# Patient Record
Sex: Male | Born: 1940 | Race: White | Hispanic: No | Marital: Married | Smoking: Former smoker
Health system: Southern US, Community
[De-identification: ages and names within clinical notes are randomized; demographics above are authoritative.]

## PROBLEM LIST (undated history)

## (undated) DIAGNOSIS — E1129 Type 2 diabetes mellitus with other diabetic kidney complication: Secondary | ICD-10-CM

## (undated) DIAGNOSIS — E785 Hyperlipidemia, unspecified: Secondary | ICD-10-CM

## (undated) DIAGNOSIS — R319 Hematuria, unspecified: Secondary | ICD-10-CM

## (undated) DIAGNOSIS — I639 Cerebral infarction, unspecified: Secondary | ICD-10-CM

## (undated) DIAGNOSIS — E538 Deficiency of other specified B group vitamins: Secondary | ICD-10-CM

## (undated) DIAGNOSIS — E039 Hypothyroidism, unspecified: Secondary | ICD-10-CM

## (undated) DIAGNOSIS — M1990 Unspecified osteoarthritis, unspecified site: Secondary | ICD-10-CM

## (undated) DIAGNOSIS — L80 Vitiligo: Secondary | ICD-10-CM

## (undated) DIAGNOSIS — D509 Iron deficiency anemia, unspecified: Principal | ICD-10-CM

## (undated) HISTORY — PX: CHOLECYSTECTOMY: SHX55

## (undated) HISTORY — PX: CERVICAL SPINE SURGERY: SHX589

## (undated) HISTORY — PX: CAROTID ENDARTERECTOMY: SUR193

## (undated) HISTORY — DX: Iron deficiency anemia, unspecified: D50.9

## (undated) HISTORY — PX: LAMINOTOMY / EXCISION DISK POSTERIOR CERVICAL SPINE: SUR749

## (undated) HISTORY — PX: NOSE SURGERY: SHX723

---

## 1999-01-05 ENCOUNTER — Encounter: Payer: Self-pay | Admitting: Neurosurgery

## 1999-01-08 ENCOUNTER — Encounter: Payer: Self-pay | Admitting: Neurosurgery

## 1999-01-08 ENCOUNTER — Observation Stay (HOSPITAL_COMMUNITY): Admission: RE | Admit: 1999-01-08 | Discharge: 1999-01-09 | Payer: Self-pay | Admitting: Neurosurgery

## 1999-02-20 ENCOUNTER — Encounter: Payer: Self-pay | Admitting: Neurosurgery

## 1999-02-20 ENCOUNTER — Ambulatory Visit (HOSPITAL_COMMUNITY): Admission: RE | Admit: 1999-02-20 | Discharge: 1999-02-20 | Payer: Self-pay | Admitting: Neurosurgery

## 1999-10-30 ENCOUNTER — Inpatient Hospital Stay (HOSPITAL_COMMUNITY)
Admission: RE | Admit: 1999-10-30 | Discharge: 1999-11-06 | Payer: Self-pay | Admitting: Physical Medicine & Rehabilitation

## 1999-11-03 ENCOUNTER — Encounter: Payer: Self-pay | Admitting: Physical Medicine & Rehabilitation

## 1999-11-06 ENCOUNTER — Inpatient Hospital Stay (HOSPITAL_COMMUNITY): Admission: AD | Admit: 1999-11-06 | Discharge: 1999-11-07 | Payer: Self-pay | Admitting: Vascular Surgery

## 2000-02-01 ENCOUNTER — Encounter
Admission: RE | Admit: 2000-02-01 | Discharge: 2000-05-01 | Payer: Self-pay | Admitting: Physical Medicine & Rehabilitation

## 2003-06-24 ENCOUNTER — Encounter
Admission: RE | Admit: 2003-06-24 | Discharge: 2003-09-22 | Payer: Self-pay | Admitting: Physical Medicine & Rehabilitation

## 2004-08-29 ENCOUNTER — Ambulatory Visit: Payer: Self-pay | Admitting: Internal Medicine

## 2004-09-23 ENCOUNTER — Ambulatory Visit: Payer: Self-pay | Admitting: Internal Medicine

## 2005-10-18 ENCOUNTER — Ambulatory Visit: Payer: Self-pay | Admitting: Unknown Physician Specialty

## 2006-12-06 ENCOUNTER — Ambulatory Visit: Payer: Self-pay | Admitting: Urology

## 2006-12-06 IMAGING — CT CT ABDOMEN AND PELVIS WITHOUT AND WITH CONTRAST
2 of 4 series · 14 of 32 positions shown, 19 images · non-contrast
Comparison: none

REASON FOR EXAM: Hematuria, patient on Metformin
COMMENTS:

PROCEDURE:     CT  - CT ABDOMEN / PELVIS  W/WO  - [DATE]  [DATE]
RESULT:     The patient has a history of hematuria.
TECHNIQUE: Axial images were obtained from the hemidiaphragms to the pubic
symphysis pre and post intravenous injection of contrast material.

[Series 2: soft tissue w/o · axial · non-contrast · 0.77mm/px · z∈[-494,-126]mm · 8 of 60 slices shown, 13 images]
[im 7/60  soft-tissue]
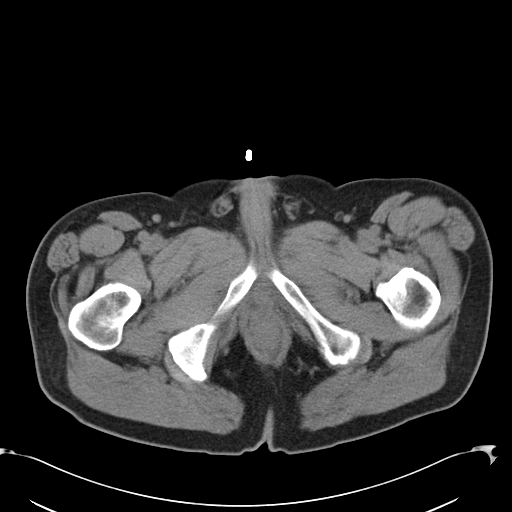
[im 7/60  bone]
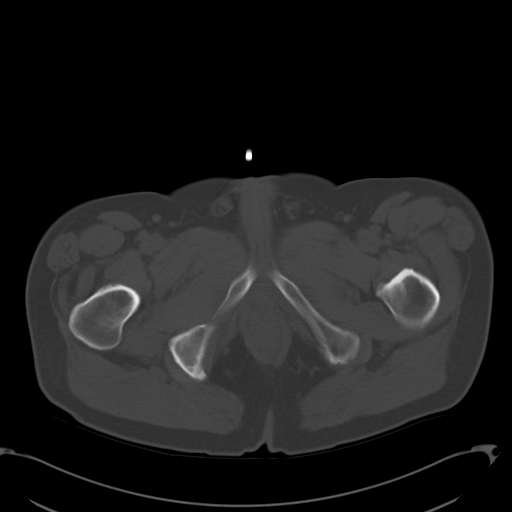
[im 14/60  soft-tissue]
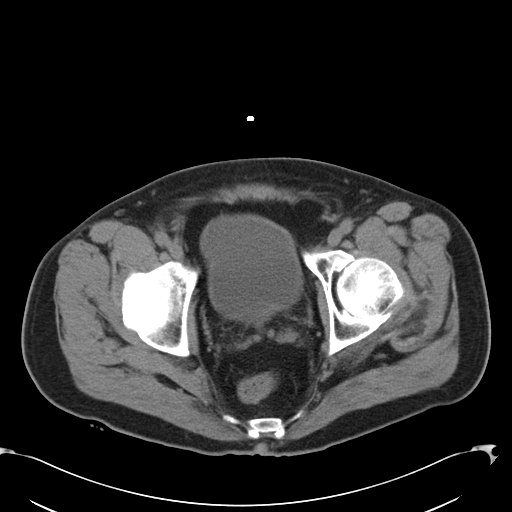
[im 20/60  soft-tissue]
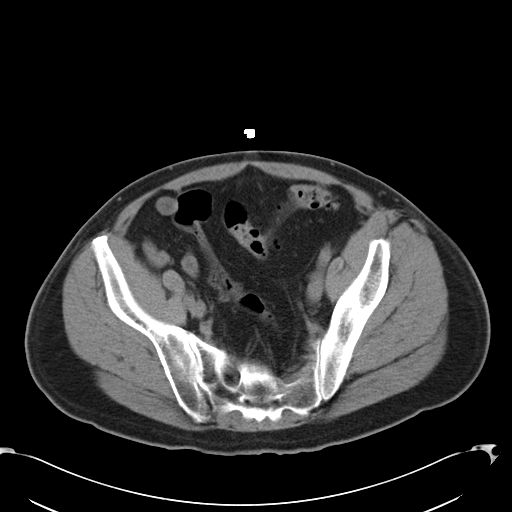
[im 27/60  soft-tissue]
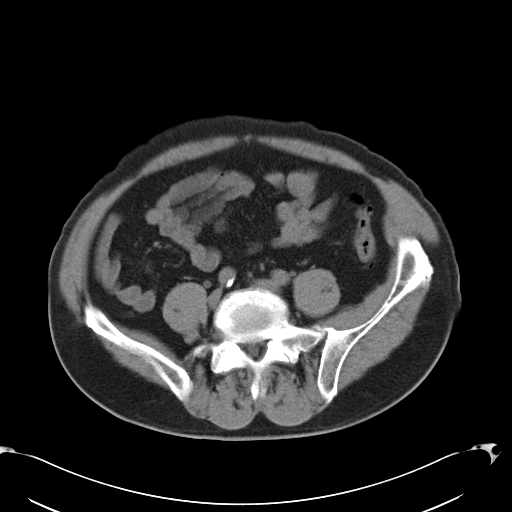
[im 33/60  soft-tissue]
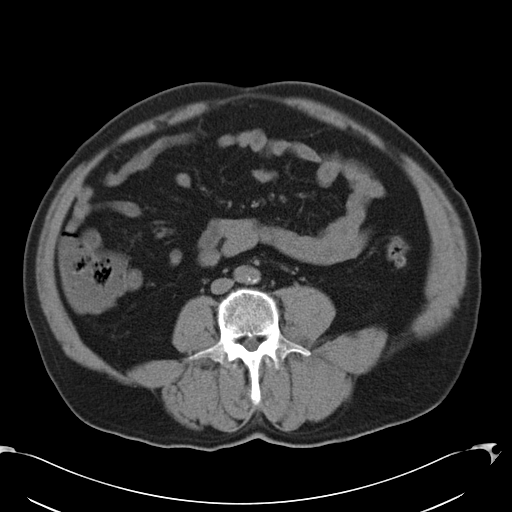
[im 33/60  lung]
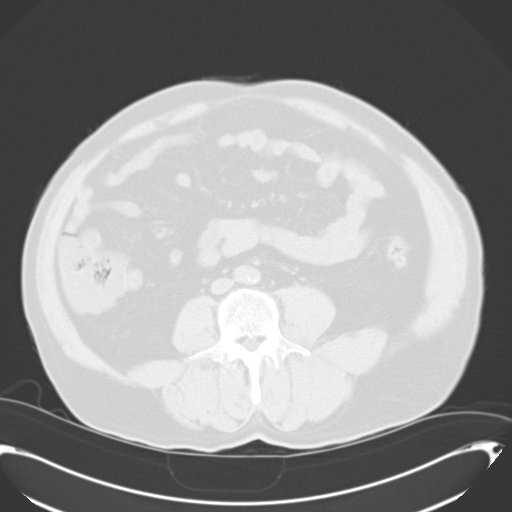
[im 40/60  soft-tissue]
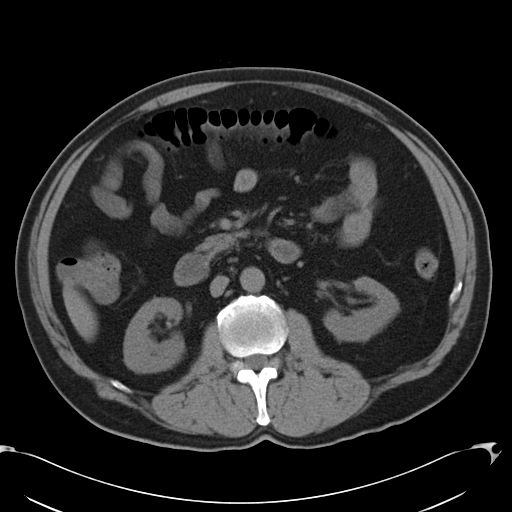
[im 40/60  lung]
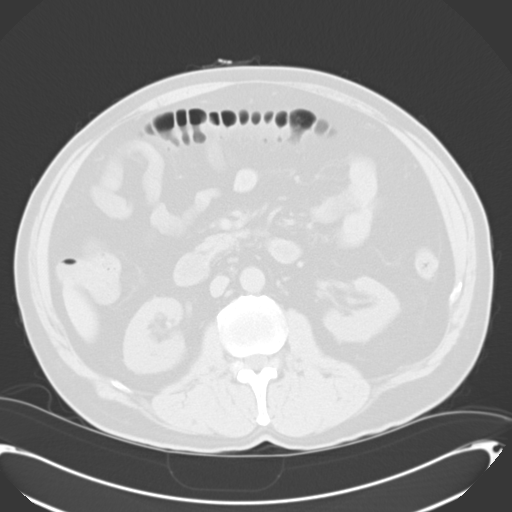
[im 46/60  soft-tissue]
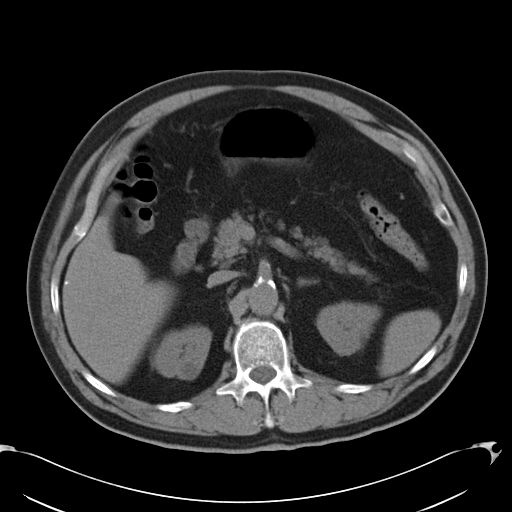
[im 46/60  lung]
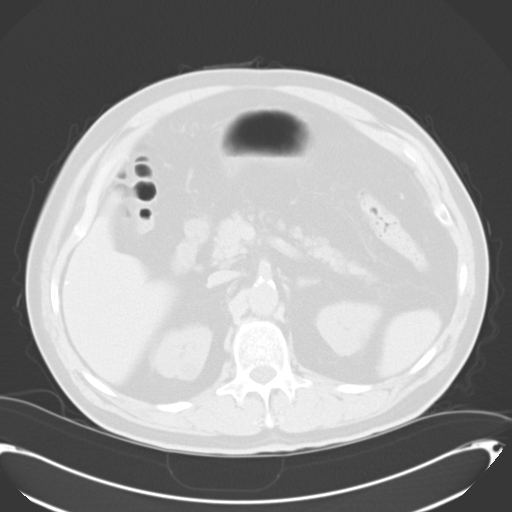
[im 53/60  soft-tissue]
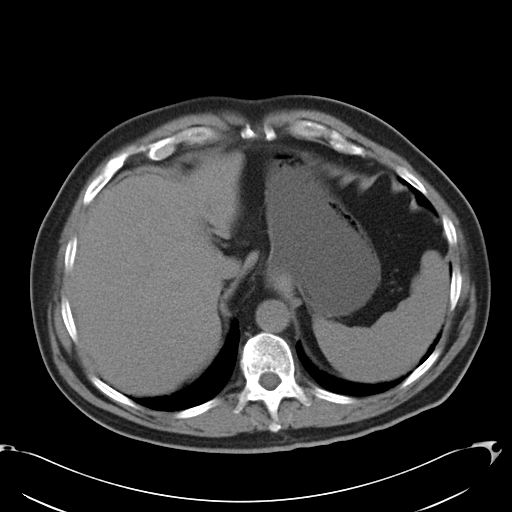
[im 53/60  lung]
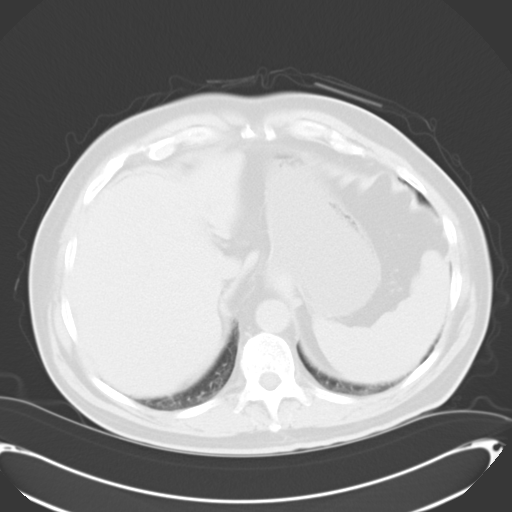

[Series 4: soft tissue with · axial · 0.77mm/px · z∈[-486,-190]mm · 6 of 60 slices shown]
[im 8/60  soft-tissue]
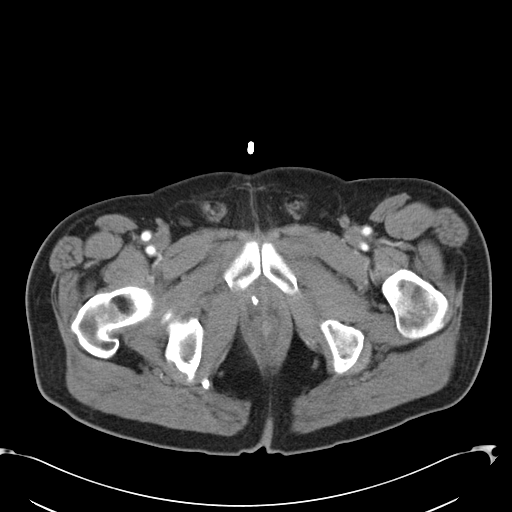
[im 15/60  soft-tissue]
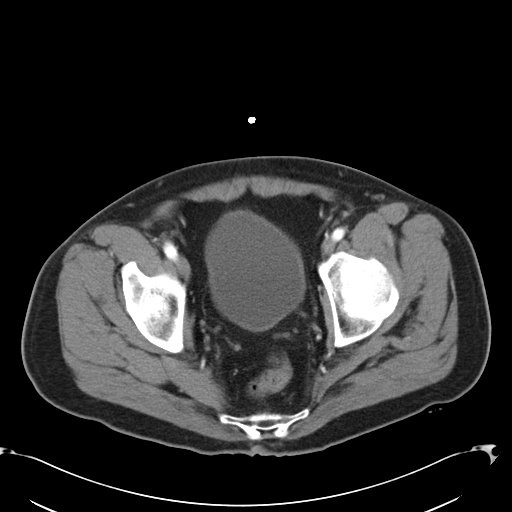
[im 23/60  soft-tissue]
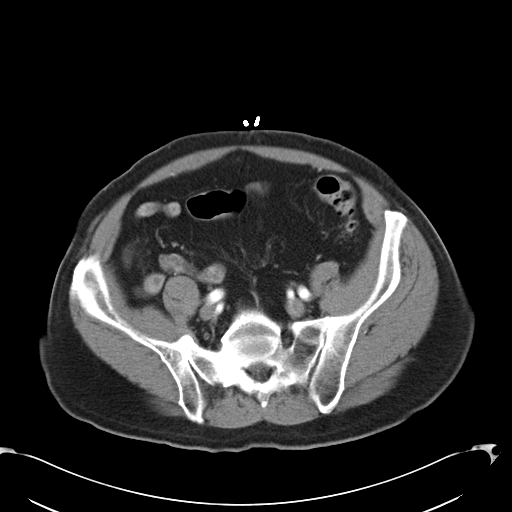
[im 30/60  soft-tissue]
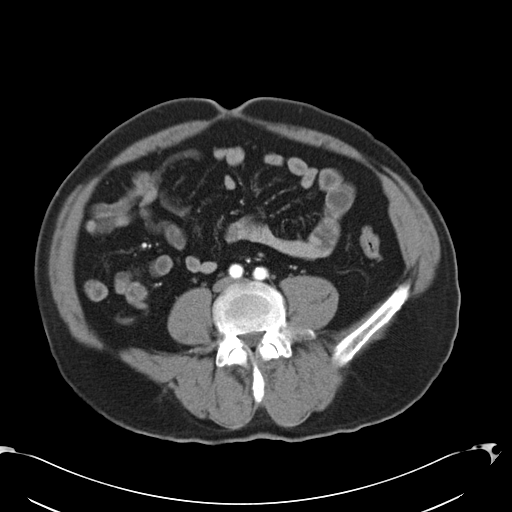
[im 37/60  soft-tissue]
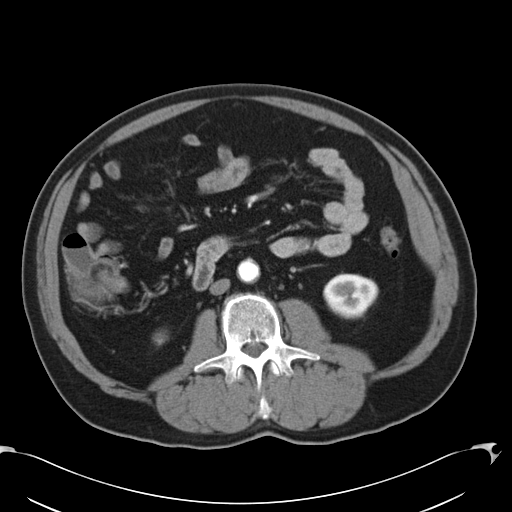
[im 45/60  soft-tissue]
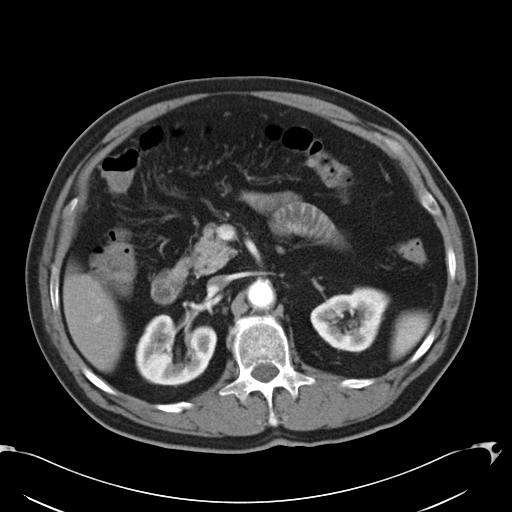

[14 of 32 positions shown; findings below may reference images not displayed]

FINDINGS: No renal or ureteral calculi are identified. No hydronephrosis is
seen. No urinary bladder masses are identified. There is some mild
impression on the floor of the urinary bladder by the prostate. Some
calcification is noted within the prostate. No diverticulitis is noted. No
pelvic or retroperitoneal adenopathy is identified. The adrenal glands are
intact as well as the pancreas, liver and spleen. Incidentally noted is a
small cyst in the upper pole of the RIGHT kidney.

Incidentally noted is a hiatal hernia.
IMPRESSION: 1.     No renal or ureteral calculi are noted.
2.     Small, RIGHT upper pole renal cyst is noted.
3.     Hiatal hernia is present.
4.     Diverticulosis without evidence of diverticulitis.

## 2010-12-14 ENCOUNTER — Ambulatory Visit: Payer: Self-pay | Admitting: Internal Medicine

## 2010-12-14 IMAGING — CT CT CHEST W/ CM
1 series · 16 of 31 positions shown, 20 images · non-contrast
Comparison: none

REASON FOR EXAM: abnormal chest xray eval hilar mass
COMMENTS:

[Series 2: chest w/ 5.0 i41f · axial · 0.71mm/px · z∈[-86,+148]mm · 16 of 51 slices shown, 20 images]
[im 2/51  mediastinal]
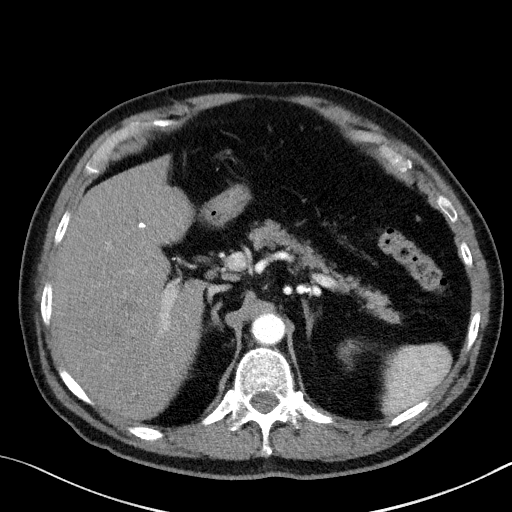
[im 2/51  lung]
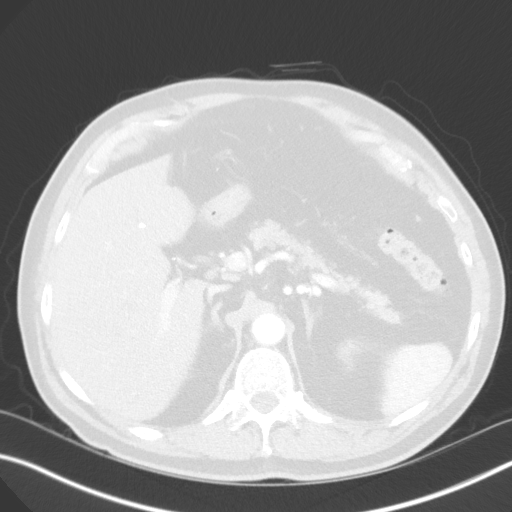
[im 6/51  lung]
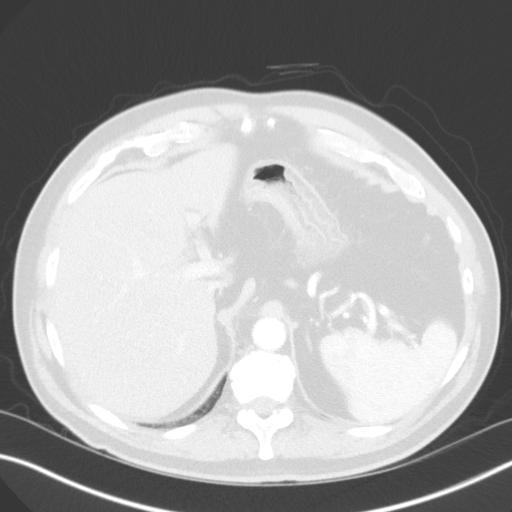
[im 10/51  lung]
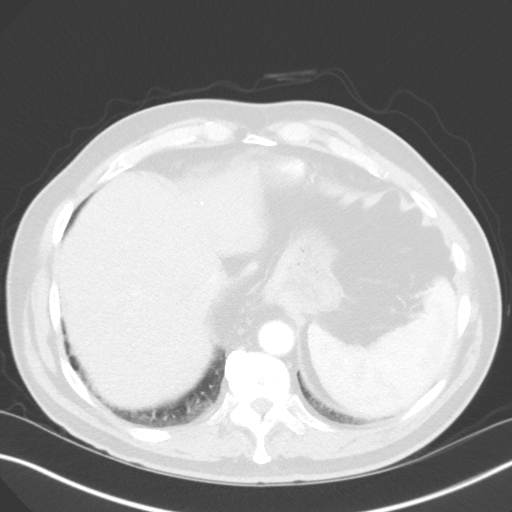
[im 12/51  lung]
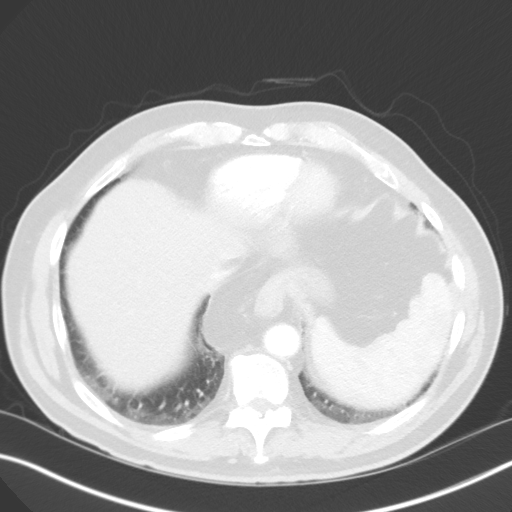
[im 15/51  mediastinal]
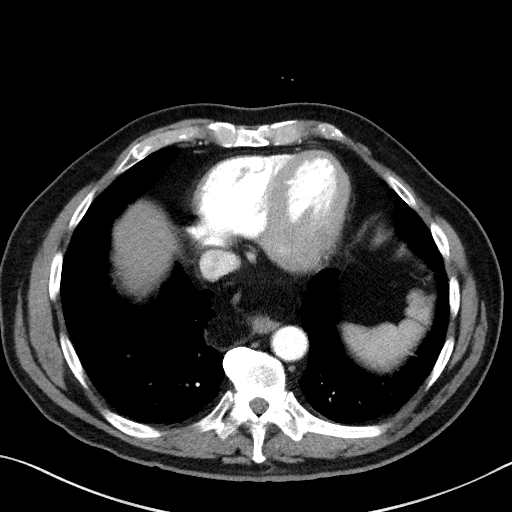
[im 15/51  lung]
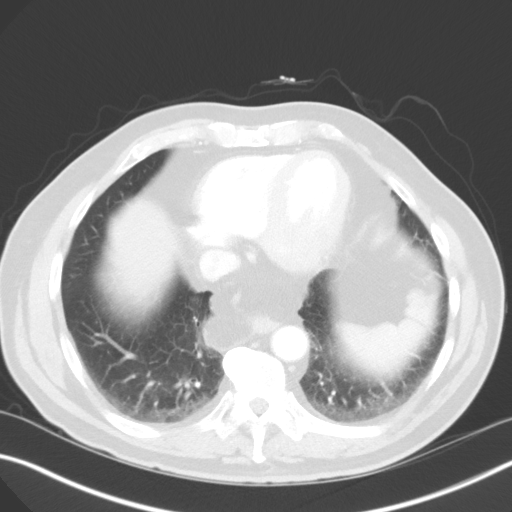
[im 17/51  lung]
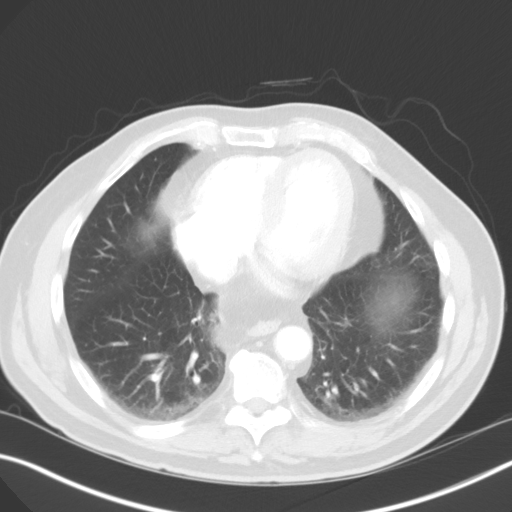
[im 21/51  lung]
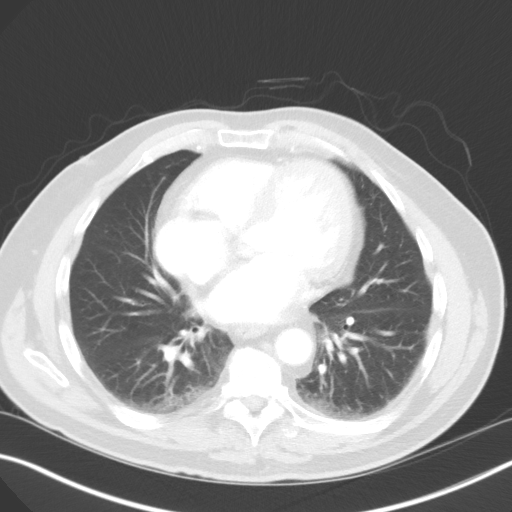
[im 24/51  lung]
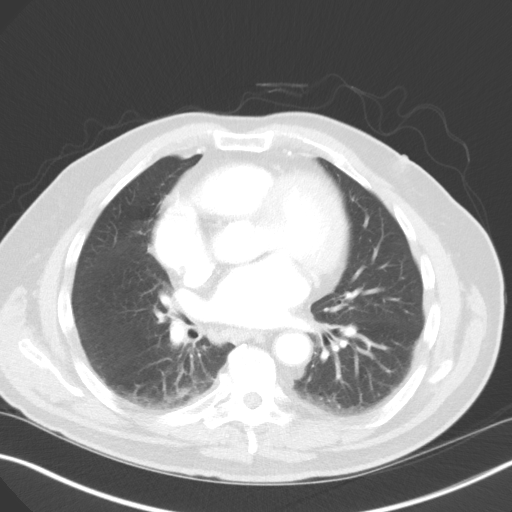
[im 26/51  mediastinal]
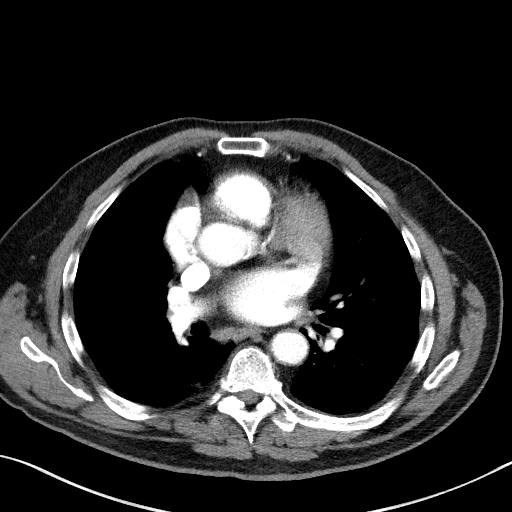
[im 26/51  lung]
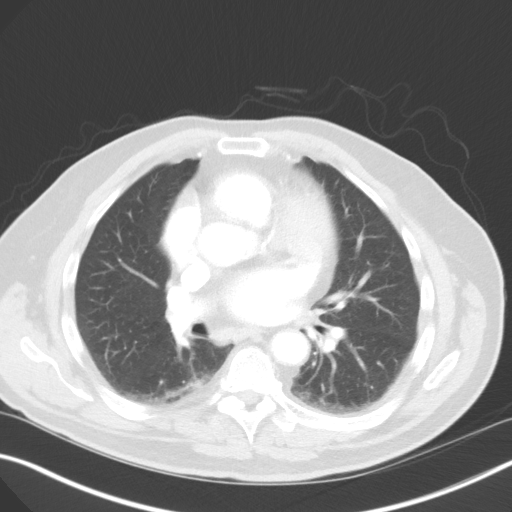
[im 30/51  lung]
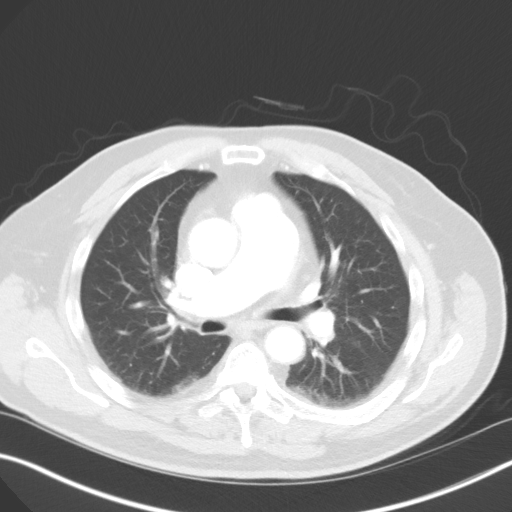
[im 32/51  lung]
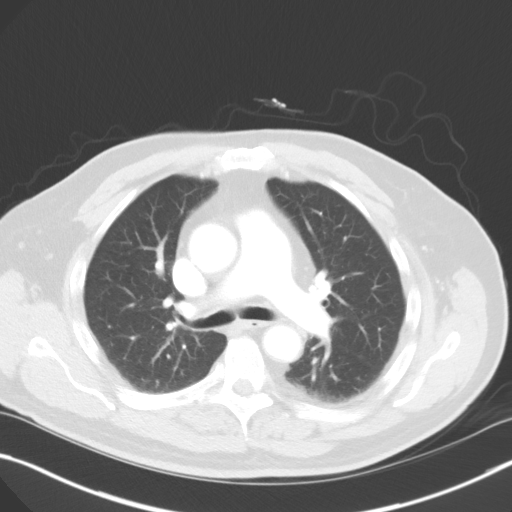
[im 34/51  lung]
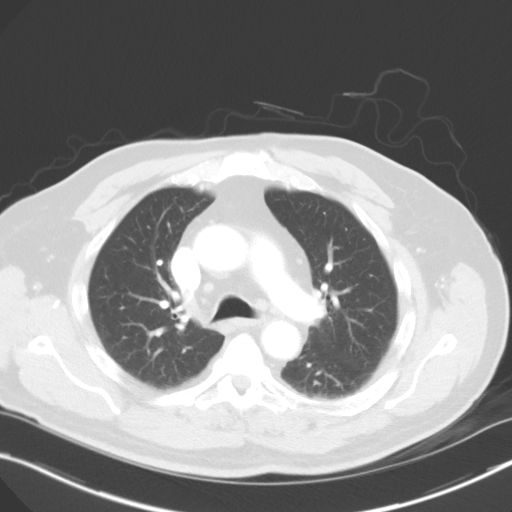
[im 38/51  mediastinal]
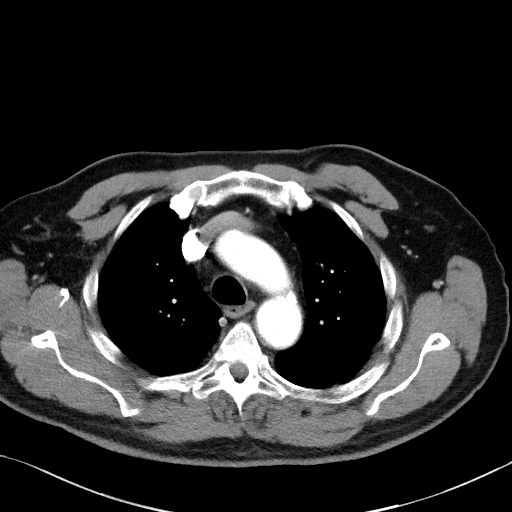
[im 38/51  lung]
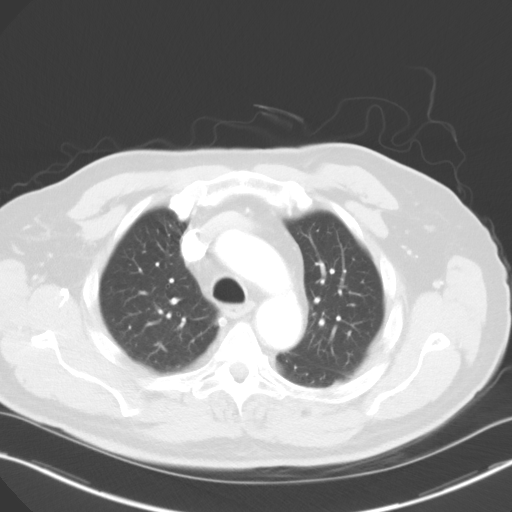
[im 41/51  lung]
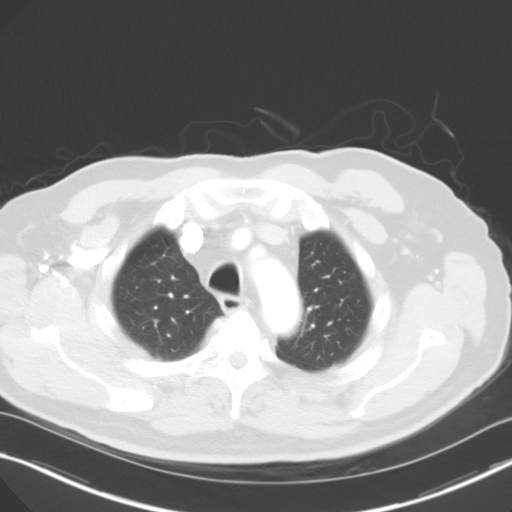
[im 45/51  lung]
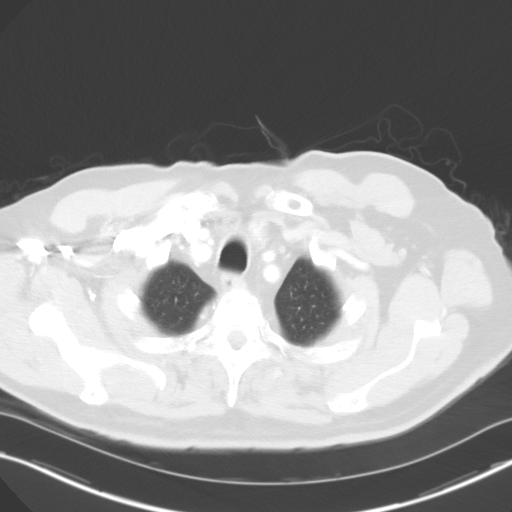
[im 49/51  lung]
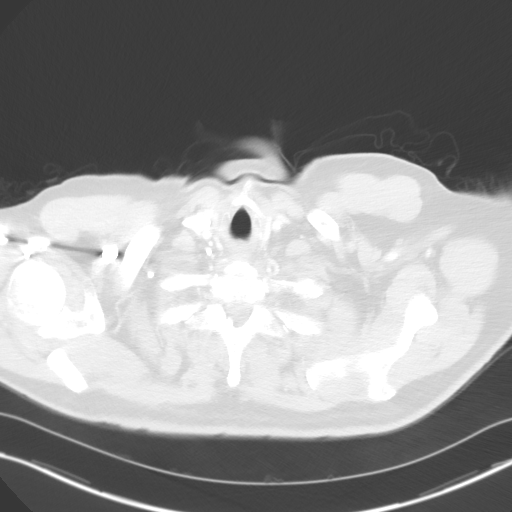

[16 of 31 positions shown; findings below may reference images not displayed]

PROCEDURE:     KCT - KCT CHEST WITH CONTRAST  - [DATE]  [DATE]

RESULT:     CT of the chest is performed utilizing 75 ml of [W3]
iodinated intravenous contrast with images reconstructed in the axial plane
at 5 mm slice thickness. The patient has no previous examination for
comparison.

Basilar atelectasis is present. There is no evidence of interstitial edema
or definite infiltrate. No discrete pulmonary parenchymal mass is evident.
There is a minimal subpleural nodular density on image #25 laterally
measuring 3.0 mm at lung window settings. No other significant nodularity is
present. There is no adenopathy. There is no pneumothorax. The upper
abdominal viscera included on the study appear unremarkable. The thyroid
lobes enhance normally.
IMPRESSION: Minimal nodular density laterally in the left upper lobe.
No hilar mass evident.

## 2011-02-26 ENCOUNTER — Ambulatory Visit: Payer: Self-pay | Admitting: Unknown Physician Specialty

## 2011-08-23 ENCOUNTER — Emergency Department (HOSPITAL_COMMUNITY)
Admission: EM | Admit: 2011-08-23 | Discharge: 2011-08-23 | Disposition: A | Discharge status: Home / Self Care | Payer: MEDICARE | Attending: Emergency Medicine | Admitting: Emergency Medicine

## 2011-08-23 ENCOUNTER — Emergency Department (HOSPITAL_COMMUNITY): Payer: MEDICARE

## 2011-08-23 ENCOUNTER — Encounter: Payer: Self-pay | Admitting: *Deleted

## 2011-08-23 DIAGNOSIS — S61219A Laceration without foreign body of unspecified finger without damage to nail, initial encounter: Secondary | ICD-10-CM

## 2011-08-23 DIAGNOSIS — IMO0002 Reserved for concepts with insufficient information to code with codable children: Secondary | ICD-10-CM | POA: Insufficient documentation

## 2011-08-23 DIAGNOSIS — S6000XA Contusion of unspecified finger without damage to nail, initial encounter: Secondary | ICD-10-CM | POA: Insufficient documentation

## 2011-08-23 DIAGNOSIS — S61209A Unspecified open wound of unspecified finger without damage to nail, initial encounter: Secondary | ICD-10-CM | POA: Insufficient documentation

## 2011-08-23 HISTORY — DX: Cerebral infarction, unspecified: I63.9

## 2011-08-23 LAB — GLUCOSE, CAPILLARY: Glucose-Capillary: 207 mg/dL — ABNORMAL HIGH (ref 70–99)

## 2011-08-23 IMAGING — CR DG FINGER MIDDLE 2+V*R*
2 series · 2 of 2 positions shown · non-contrast
Comparison: None

CLINICAL DATA: Middle finger injury.

RIGHT MIDDLE FINGER 2+V

[view not recorded (1 of 2)]
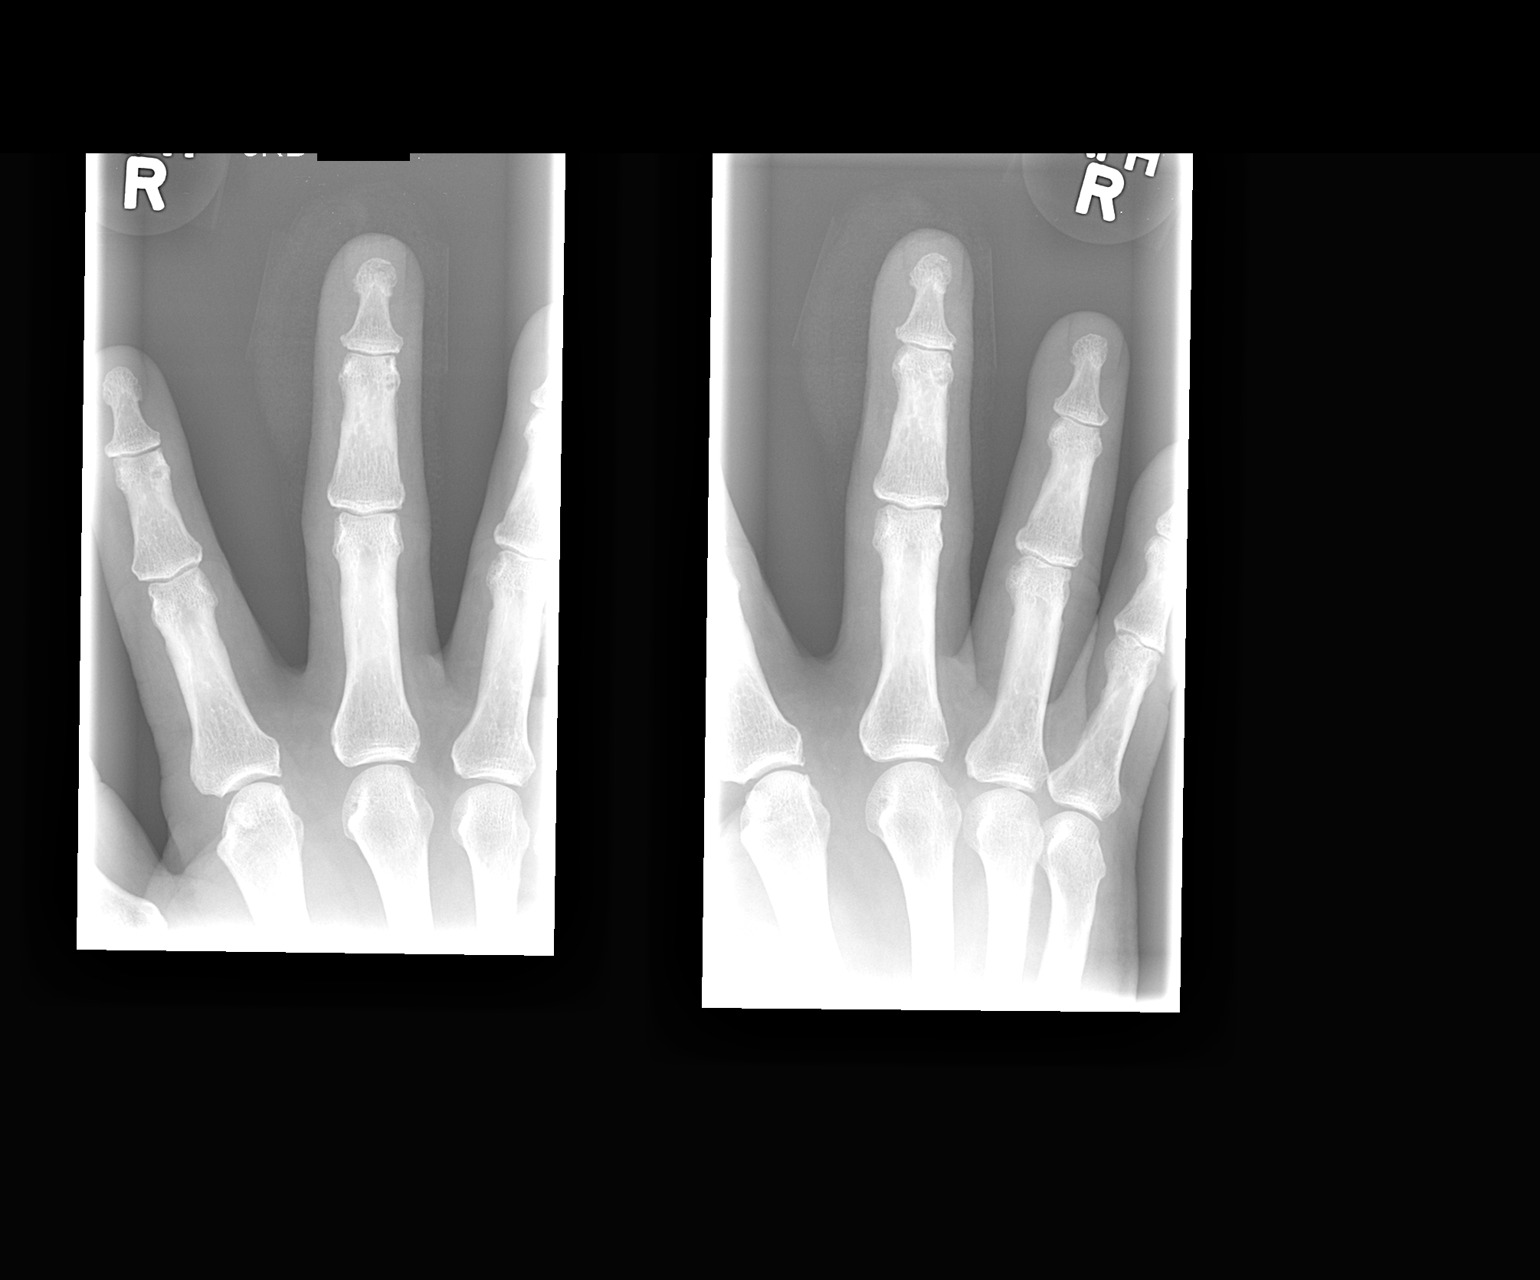

[view not recorded (2 of 2)]
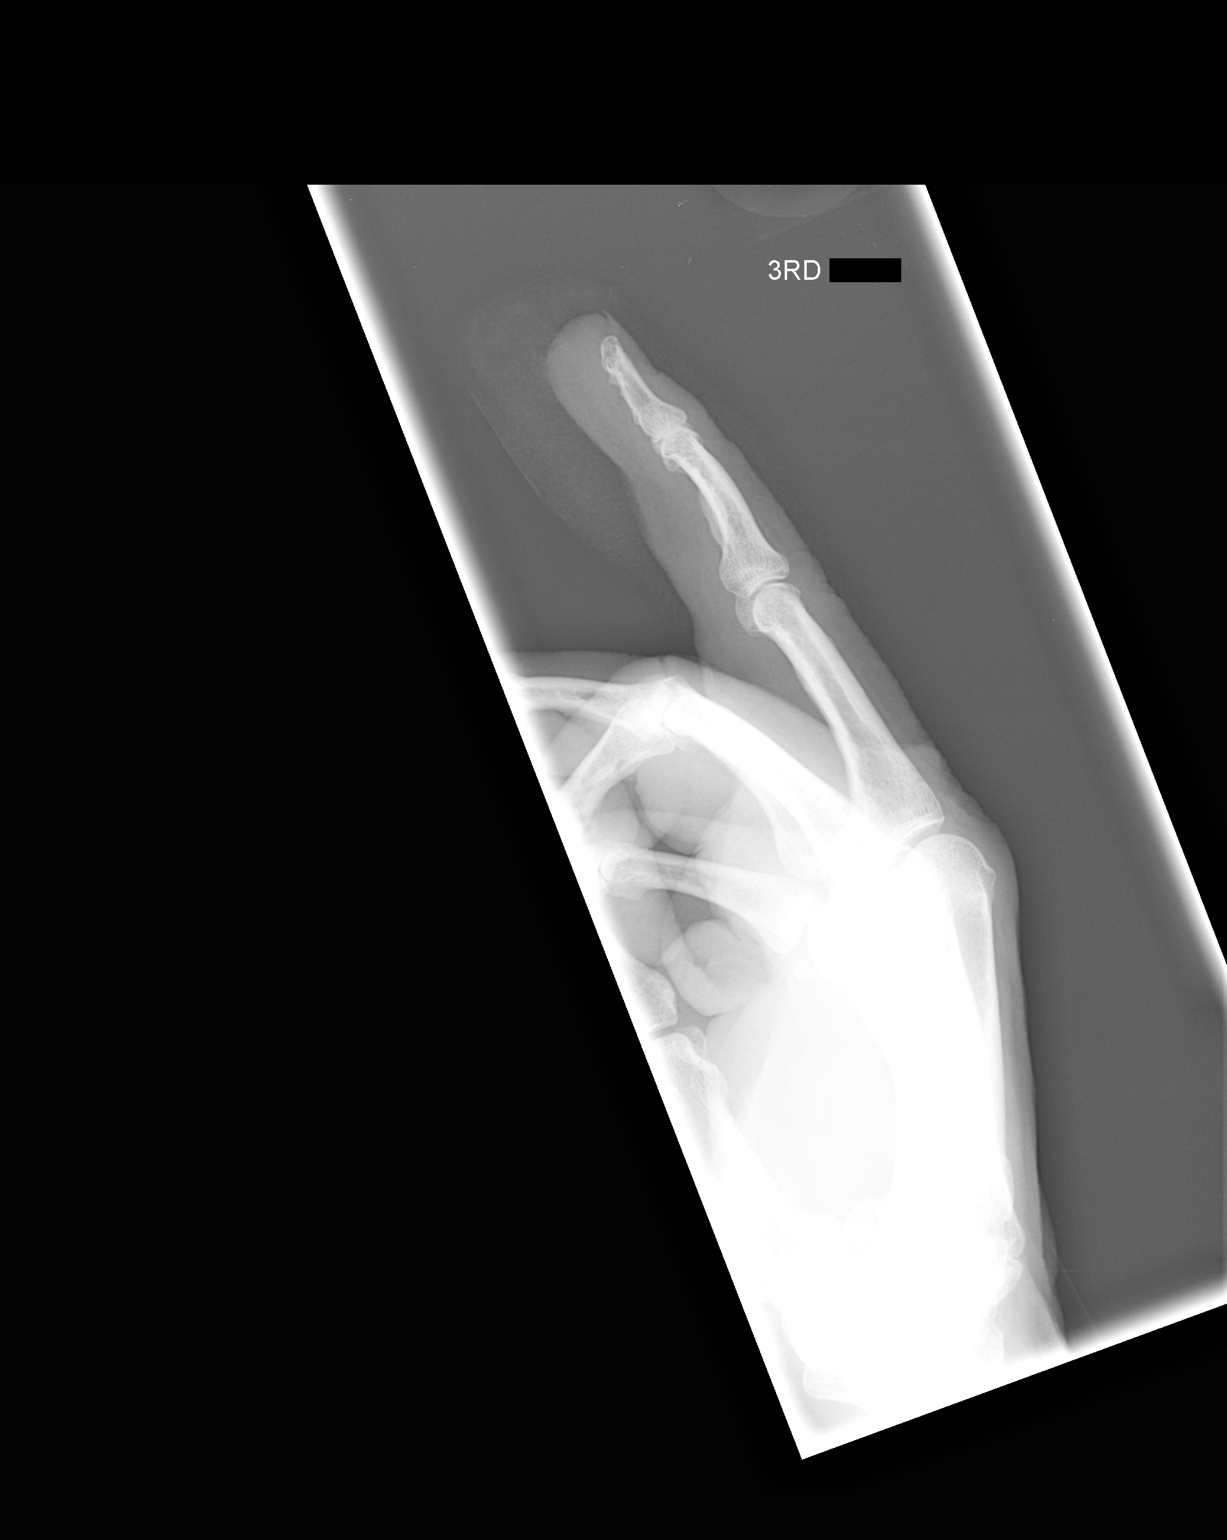

[2 of 2 positions shown; findings below may reference images not displayed]

FINDINGS: No acute bony abnormality.  Specifically, no fracture,
subluxation, or dislocation.  Soft tissues are intact.  No
radiopaque foreign bodies.
IMPRESSION: No acute bony abnormality.

## 2011-08-23 MED ORDER — LIDOCAINE HCL (PF) 1 % IJ SOLN
5.0000 mL | Freq: Once | INTRAMUSCULAR | Status: AC
  Administered 2011-08-23: 5 mL
  Filled 2011-08-23: qty 5

## 2011-08-23 MED ORDER — HYDROCODONE-ACETAMINOPHEN 7.5-325 MG PO TABS: 1.0000 | ORAL_TABLET | ORAL | Status: AC | PRN

## 2011-08-23 MED ORDER — TETANUS-DIPHTH-ACELL PERTUSSIS 5-2.5-18.5 LF-MCG/0.5 IM SUSP
0.5000 mL | Freq: Once | INTRAMUSCULAR | Status: AC
  Administered 2011-08-23: 0.5 mL via INTRAMUSCULAR
  Filled 2011-08-23: qty 0.5

## 2011-08-23 NOTE — ED Notes (Signed)
Pt has laceration to his right middle finger. Hit it on a wood splitter. Still bleeding at this time. Pressure dressing applied.

## 2011-08-23 NOTE — ED Provider Notes (Signed)
History     CSN: [id number] Arrival date & time: 08/23/2011  4:21 PM  Chief Complaint  Patient presents with  . Laceration    HPI  (Consider location/radiation/quality/duration/timing/severity/associated sxs/prior treatment)  HPI Comments: Pt was splitting wood when he hit his finger on the wood splitter and then had the finger mashed by a piece of wood. He is not up to date on tetanus.  Patient is a 70 y.o. male presenting with skin laceration. The history is provided by the patient and the spouse.  Laceration  The incident occurred 1 to 2 hours ago. The laceration is located on the right hand. The laceration is 1 cm in size. The laceration mechanism was a a metal edge. The pain is moderate. He reports no foreign bodies present. His tetanus status is out of date.    Past Medical History  Diagnosis Date  . Diabetes mellitus   . Stroke     Past Surgical History  Procedure Date  . Cholecystectomy   . Nose surgery   . Carotid endarterectomy   . Cervical spine surgery     History reviewed. No pertinent family history.  History  Substance Use Topics  . Smoking status: Former Games developer  . Smokeless tobacco: Not on file  . Alcohol Use: No      Review of Systems  Review of Systems  Constitutional: Negative for activity change.       All ROS Neg except as noted in HPI  HENT: Negative for nosebleeds and neck pain.   Eyes: Negative for photophobia and discharge.  Respiratory: Negative for cough, shortness of breath and wheezing.   Cardiovascular: Negative for chest pain and palpitations.  Gastrointestinal: Negative for abdominal pain and blood in stool.  Genitourinary: Negative for dysuria, frequency and hematuria.  Musculoskeletal: Positive for arthralgias. Negative for back pain.  Skin: Negative.   Neurological: Positive for weakness. Negative for dizziness, seizures and speech difficulty.  Psychiatric/Behavioral: Negative for hallucinations and confusion.     Allergies  Sulfa antibiotics  Home Medications  No current outpatient prescriptions on file.  Physical Exam    BP 118/52  Pulse 86  Resp 18  Ht 5\' 6"  (1.676 m)  Wt 181 lb 6 oz (82.271 kg)  BMI 29.27 kg/m2  SpO2 96%  Physical Exam  Nursing note and vitals reviewed. Constitutional: He is oriented to person, place, and time. He appears well-developed and well-nourished.  Non-toxic appearance.  HENT:  Head: Normocephalic.  Right Ear: Tympanic membrane and external ear normal.  Left Ear: Tympanic membrane and external ear normal.  Eyes: EOM and lids are normal. Pupils are equal, round, and reactive to light.  Neck: Normal range of motion. Neck supple. Carotid bruit is not present.  Cardiovascular: Normal rate, regular rhythm, normal heart sounds, intact distal pulses and normal pulses.   Pulmonary/Chest: Breath sounds normal. No respiratory distress.  Abdominal: Soft. Bowel sounds are normal. There is no tenderness. There is no guarding.  Musculoskeletal: Normal range of motion.       Laceration of the palmar surface of the distal right 3rd finger. FROM and good sensory.  Lymphadenopathy:       Head (right side): No submandibular adenopathy present.       Head (left side): No submandibular adenopathy present.    He has no cervical adenopathy.  Neurological: He is alert and oriented to person, place, and time. He has normal strength. No cranial nerve deficit or sensory deficit.  Skin: Skin is warm and dry.  Psychiatric: He has a normal mood and affect. His speech is normal.    ED Course  ORTHOPEDIC INJURY TREATMENT Date/Time: 08/23/2011 5:14 PM Performed by: Kathie Dike Authorized by: Ward Givens Consent: Verbal consent obtained. Risks and benefits: risks, benefits and alternatives were discussed Consent given by: patient Patient understanding: patient states understanding of the procedure being performed Patient identity confirmed: arm band Time out: Immediately  prior to procedure a "time out" was called to verify the correct patient, procedure, equipment, support staff and site/side marked as required. Injury location: finger Location details: right long finger Injury type: soft tissue Pre-procedure neurovascular assessment: neurovascularly intact Pre-procedure distal perfusion: normal Pre-procedure neurological function: normal Pre-procedure range of motion: normal Local anesthesia used: yes Anesthesia: digital block Local anesthetic: lidocaine 1% without epinephrine Patient sedated: no Post-procedure neurovascular assessment: post-procedure neurovascularly intact Post-procedure distal perfusion: normal Post-procedure neurological function: normal Post-procedure range of motion: normal Patient tolerance: Patient tolerated the procedure well with no immediate complications. Comments: Subungual hematoma evacuated using sterile technique and an 18 gauge needle. Dressing applied by me.  LACERATION REPAIR Date/Time: 08/23/2011 5:18 PM Performed by: Kathie Dike Authorized by: Ward Givens Consent: Verbal consent obtained. Risks and benefits: risks, benefits and alternatives were discussed Consent given by: patient Patient understanding: patient states understanding of the procedure being performed Patient identity confirmed: arm band Time out: Immediately prior to procedure a "time out" was called to verify the correct patient, procedure, equipment, support staff and site/side marked as required. Body area: upper extremity Location details: right long finger Laceration length: 1 cm Tendon involvement: none Nerve involvement: none Vascular damage: no Anesthesia: digital block Local anesthetic: lidocaine 1% without epinephrine Patient sedated: no Preparation: Patient was prepped and draped in the usual sterile fashion. Irrigation solution: saline Amount of cleaning: standard Debridement: none Degree of undermining: none Skin closure:  4-0 nylon Number of sutures: 4 Technique: simple Approximation: close Dressing: 4x4 sterile gauze and gauze roll Comments: Sterile dressing applied by me.   (including critical care time)  Labs Reviewed  GLUCOSE, CAPILLARY - Abnormal; Notable for the following:    Glucose-Capillary 207 (*)    All other components within normal limits   Dg Finger Middle Right  08/23/2011  *RADIOLOGY REPORT*  Clinical Data: Middle finger injury.  RIGHT MIDDLE FINGER 2+V  Comparison: None  Findings: No acute bony abnormality.  Specifically, no fracture, subluxation, or dislocation.  Soft tissues are intact.  No radiopaque foreign bodies.  IMPRESSION: No acute bony abnormality.  Original Report Authenticated By: Cyndie Chime, M.D.     Dx: 1. Laceration distal tip of the right 3rd finger  2. Subungual hematoma right 3rd finger.  MDM I have reviewed nursing notes, vital signs, and all appropriate lab and imaging results for this patient.        Kathie Dike, [place] 08/23/11 1722

## 2011-08-24 NOTE — ED Provider Notes (Signed)
Medical screening examination/treatment/procedure(s) were performed by non-physician practitioner and as supervising physician I was immediately available for consultation/collaboration. Jermell Holeman, MD, FACEP   Joeangel Jeanpaul L Zela Sobieski, MD 08/24/11 0050 

## 2011-08-30 ENCOUNTER — Encounter (HOSPITAL_COMMUNITY): Payer: Self-pay | Admitting: Emergency Medicine

## 2011-08-30 ENCOUNTER — Emergency Department (HOSPITAL_COMMUNITY)
Admission: EM | Admit: 2011-08-30 | Discharge: 2011-08-30 | Disposition: A | Discharge status: Home / Self Care | Payer: MEDICARE | Attending: Emergency Medicine | Admitting: Emergency Medicine

## 2011-08-30 DIAGNOSIS — Z4802 Encounter for removal of sutures: Secondary | ICD-10-CM | POA: Insufficient documentation

## 2011-08-30 DIAGNOSIS — IMO0002 Reserved for concepts with insufficient information to code with codable children: Secondary | ICD-10-CM

## 2011-08-30 MED ORDER — BACITRACIN ZINC 500 UNIT/GM EX OINT
TOPICAL_OINTMENT | CUTANEOUS | Status: AC
  Administered 2011-08-30: 13:00:00
  Filled 2011-08-30: qty 0.9

## 2011-08-30 NOTE — ED Notes (Signed)
Patient here for suture removal to right middle finger. Had sutures placed here on Monday.

## 2011-08-30 NOTE — ED Provider Notes (Signed)
History     CSN: [id number] Arrival date & time: 08/30/2011 11:16 AM  Chief Complaint  Patient presents with  . Suture / Staple Removal    (Consider location/radiation/quality/duration/timing/severity/associated sxs/prior treatment) Patient is a 70 y.o. male presenting with suture removal. The history is provided by the patient.  Suture / Staple Removal  The sutures were placed 7 to 10 days ago. There has been no treatment since the wound repair. There has been no drainage from the wound. There is no redness present. The swelling has improved. The pain has improved.    Past Medical History  Diagnosis Date  . Diabetes mellitus   . Stroke     Past Surgical History  Procedure Date  . Cholecystectomy   . Nose surgery   . Carotid endarterectomy   . Cervical spine surgery     History reviewed. No pertinent family history.  History  Substance Use Topics  . Smoking status: Former Smoker -- 1.0 packs/day for 35 years    Types: Pipe, Cigars    Quit date: 08/30/1999  . Smokeless tobacco: Never Used  . Alcohol Use: No      Review of Systems  Constitutional: Negative for activity change.       All ROS Neg except as noted in HPI  HENT: Negative for nosebleeds and neck pain.   Eyes: Negative for photophobia and discharge.  Respiratory: Negative for cough, shortness of breath and wheezing.   Cardiovascular: Negative for chest pain and palpitations.  Gastrointestinal: Negative for abdominal pain and blood in stool.  Genitourinary: Negative for dysuria, frequency and hematuria.  Musculoskeletal: Negative for back pain and arthralgias.  Skin: Negative.   Neurological: Negative for dizziness, seizures and speech difficulty.  Psychiatric/Behavioral: Negative for hallucinations and confusion.    Allergies  Sulfa antibiotics  Home Medications   Current Outpatient Rx  Name Route Sig Dispense Refill  . HYDROCODONE-ACETAMINOPHEN 7.5-325 MG PO TABS Oral Take 1 tablet by mouth  every 4 (four) hours as needed for pain. 20 tablet 0    BP 150/72  Pulse 64  Temp(Src) 97.7 F (36.5 C) (Oral)  Resp 18  Ht 5\' 6"  (1.676 m)  Wt 181 lb (82.101 kg)  BMI 29.21 kg/m2  SpO2 100%  Physical Exam  Nursing note and vitals reviewed. Constitutional: He is oriented to person, place, and time. He appears well-developed and well-nourished.  Non-toxic appearance.  HENT:  Head: Normocephalic.  Right Ear: Tympanic membrane and external ear normal.  Left Ear: Tympanic membrane and external ear normal.  Eyes: EOM and lids are normal. Pupils are equal, round, and reactive to light.  Neck: Normal range of motion. Neck supple. Carotid bruit is not present.  Cardiovascular: Normal rate, regular rhythm, normal heart sounds, intact distal pulses and normal pulses.   Pulmonary/Chest: Breath sounds normal. No respiratory distress.  Abdominal: Soft. Bowel sounds are normal. There is no tenderness. There is no guarding.  Musculoskeletal: Normal range of motion.       The swelling and pain of the right middle finger are much improved. The sutures are inplace and no signs of infection.  Lymphadenopathy:       Head (right side): No submandibular adenopathy present.       Head (left side): No submandibular adenopathy present.    He has no cervical adenopathy.  Neurological: He is alert and oriented to person, place, and time. He has normal strength. No cranial nerve deficit or sensory deficit.  Skin: Skin is warm and dry.  Psychiatric: He has a normal mood and affect. His speech is normal.    ED Course: Sutures removed by nursing staff. Pt advised to return if any signs of infection or problem.  Procedures (including critical care time)  Labs Reviewed - No data to display No results found.   1. Encounter for re-check of laceration wound       MDM  I have reviewed nursing notes, vital signs, and all appropriate lab and imaging results for this patient.        Kathie Dike,  PA 08/30/11 [address], [place] 08/31/11 [address] [place], [place] 09/06/11 [phone]

## 2011-08-30 NOTE — ED Notes (Signed)
4 sutures removed per verbal instructions of H. Danae Orleans.  Pt tolerated well. Denies increase in pain at this time. Small amt bleeding noted scattered at laceration line. No infection noted.  Tip of middle finger remains swollen and ecchymosis  Not on fingernail bed.

## 2011-08-30 NOTE — ED Notes (Signed)
Pt presents with simple suture removal to middle rt finger.  Site edges are well approximated with no s/s of infection.

## 2011-09-17 NOTE — ED Provider Notes (Signed)
Evaluation and management procedures were performed by the PA/NP under my supervision/collaboration.    Avondre Richens D Allante Beane, MD 09/17/11 1929 

## 2015-01-16 DIAGNOSIS — E782 Mixed hyperlipidemia: Secondary | ICD-10-CM | POA: Diagnosis not present

## 2015-01-16 DIAGNOSIS — E1129 Type 2 diabetes mellitus with other diabetic kidney complication: Secondary | ICD-10-CM | POA: Diagnosis not present

## 2015-01-16 DIAGNOSIS — Z0000 Encounter for general adult medical examination without abnormal findings: Secondary | ICD-10-CM | POA: Diagnosis not present

## 2015-01-16 DIAGNOSIS — D508 Other iron deficiency anemias: Secondary | ICD-10-CM | POA: Diagnosis not present

## 2015-02-20 DIAGNOSIS — L570 Actinic keratosis: Secondary | ICD-10-CM | POA: Diagnosis not present

## 2015-02-20 DIAGNOSIS — X32XXXA Exposure to sunlight, initial encounter: Secondary | ICD-10-CM | POA: Diagnosis not present

## 2015-02-20 DIAGNOSIS — L80 Vitiligo: Secondary | ICD-10-CM | POA: Diagnosis not present

## 2015-02-20 DIAGNOSIS — Z85828 Personal history of other malignant neoplasm of skin: Secondary | ICD-10-CM | POA: Diagnosis not present

## 2015-02-28 ENCOUNTER — Ambulatory Visit
Admit: 2015-02-28 | Disposition: A | Discharge status: Home / Self Care | Payer: Self-pay | Attending: Internal Medicine | Admitting: Internal Medicine

## 2015-03-14 DIAGNOSIS — D509 Iron deficiency anemia, unspecified: Secondary | ICD-10-CM | POA: Diagnosis not present

## 2015-03-19 ENCOUNTER — Ambulatory Visit
Admit: 2015-03-19 | Disposition: A | Discharge status: Home / Self Care | Payer: Self-pay | Attending: Internal Medicine | Admitting: Internal Medicine

## 2015-03-28 DIAGNOSIS — Z7982 Long term (current) use of aspirin: Secondary | ICD-10-CM | POA: Diagnosis not present

## 2015-03-28 DIAGNOSIS — E785 Hyperlipidemia, unspecified: Secondary | ICD-10-CM | POA: Diagnosis not present

## 2015-03-28 DIAGNOSIS — E119 Type 2 diabetes mellitus without complications: Secondary | ICD-10-CM | POA: Diagnosis not present

## 2015-03-28 DIAGNOSIS — E039 Hypothyroidism, unspecified: Secondary | ICD-10-CM | POA: Diagnosis not present

## 2015-03-28 DIAGNOSIS — L80 Vitiligo: Secondary | ICD-10-CM | POA: Diagnosis not present

## 2015-03-28 DIAGNOSIS — R319 Hematuria, unspecified: Secondary | ICD-10-CM | POA: Diagnosis not present

## 2015-03-28 DIAGNOSIS — Z79899 Other long term (current) drug therapy: Secondary | ICD-10-CM | POA: Diagnosis not present

## 2015-03-28 DIAGNOSIS — E538 Deficiency of other specified B group vitamins: Secondary | ICD-10-CM | POA: Diagnosis not present

## 2015-03-28 DIAGNOSIS — D509 Iron deficiency anemia, unspecified: Secondary | ICD-10-CM | POA: Diagnosis not present

## 2015-03-28 LAB — IRON AND TIBC
IRON: 70 ug/dL
Iron Bind.Cap.(Total): 529 — ABNORMAL HIGH (ref 250–450)
Iron Saturation: 13.2
UNBOUND IRON-BIND. CAP.: 459.1

## 2015-03-28 LAB — CBC CANCER CENTER
BASOS PCT: 0.3 %
Basophil #: 0 x10 3/mm (ref 0.0–0.1)
EOS ABS: 0.3 x10 3/mm (ref 0.0–0.7)
Eosinophil %: 3.2 %
HCT: 33.8 % — AB (ref 40.0–52.0)
HGB: 10.4 g/dL — AB (ref 13.0–18.0)
LYMPHS PCT: 18.4 %
Lymphocyte #: 1.5 x10 3/mm (ref 1.0–3.6)
MCH: 23.1 pg — ABNORMAL LOW (ref 26.0–34.0)
MCHC: 30.7 g/dL — ABNORMAL LOW (ref 32.0–36.0)
MCV: 75 fL — AB (ref 80–100)
MONO ABS: 0.7 x10 3/mm (ref 0.2–1.0)
Monocyte %: 7.8 %
Neutrophil #: 5.9 x10 3/mm (ref 1.4–6.5)
Neutrophil %: 70.3 %
Platelet: 229 x10 3/mm (ref 150–440)
RBC: 4.5 10*6/uL (ref 4.40–5.90)
RDW: 18.5 % — ABNORMAL HIGH (ref 11.5–14.5)
WBC: 8.4 x10 3/mm (ref 3.8–10.6)

## 2015-03-28 LAB — RETICULOCYTES
ABSOLUTE RETIC COUNT: 0.0568 10*6/uL (ref 0.019–0.186)
Reticulocyte: 1.26 % (ref 0.4–3.1)

## 2015-03-28 LAB — FOLATE: FOLIC ACID: 21 ng/mL

## 2015-03-28 LAB — LACTATE DEHYDROGENASE: LDH: 133 U/L

## 2015-03-28 LAB — FERRITIN: FERRITIN (ARMC): 9 ng/mL — AB

## 2015-04-01 LAB — PROT IMMUNOELECTROPHORES(ARMC)

## 2015-04-04 ENCOUNTER — Inpatient Hospital Stay: Payer: Commercial Managed Care - HMO | Attending: Internal Medicine

## 2015-04-04 ENCOUNTER — Encounter: Payer: Self-pay | Admitting: *Deleted

## 2015-04-04 ENCOUNTER — Other Ambulatory Visit: Payer: Self-pay | Admitting: *Deleted

## 2015-04-04 VITALS — BP 183/72 | HR 52 | Temp 96.2°F | Resp 20

## 2015-04-04 DIAGNOSIS — D509 Iron deficiency anemia, unspecified: Secondary | ICD-10-CM

## 2015-04-04 DIAGNOSIS — Z79899 Other long term (current) drug therapy: Secondary | ICD-10-CM | POA: Diagnosis not present

## 2015-04-04 HISTORY — DX: Iron deficiency anemia, unspecified: D50.9

## 2015-04-04 MED ORDER — SODIUM CHLORIDE 0.9 % IV SOLN
Freq: Once | INTRAVENOUS | Status: AC
  Administered 2015-04-04: 10 mL/h via INTRAVENOUS
  Filled 2015-04-04: qty 250

## 2015-04-04 MED ORDER — SODIUM CHLORIDE 0.9 % IV SOLN
500.0000 mg | Freq: Once | INTRAVENOUS | Status: AC
  Administered 2015-04-04: 500 mg via INTRAVENOUS
  Filled 2015-04-04: qty 25

## 2015-04-09 DIAGNOSIS — Z79899 Other long term (current) drug therapy: Secondary | ICD-10-CM | POA: Diagnosis not present

## 2015-04-09 DIAGNOSIS — D509 Iron deficiency anemia, unspecified: Secondary | ICD-10-CM | POA: Diagnosis not present

## 2015-04-10 DIAGNOSIS — M5414 Radiculopathy, thoracic region: Secondary | ICD-10-CM | POA: Diagnosis not present

## 2015-04-10 DIAGNOSIS — E1129 Type 2 diabetes mellitus with other diabetic kidney complication: Secondary | ICD-10-CM | POA: Diagnosis not present

## 2015-04-10 DIAGNOSIS — D509 Iron deficiency anemia, unspecified: Secondary | ICD-10-CM | POA: Diagnosis not present

## 2015-04-10 DIAGNOSIS — Z79899 Other long term (current) drug therapy: Secondary | ICD-10-CM | POA: Diagnosis not present

## 2015-04-18 ENCOUNTER — Inpatient Hospital Stay: Payer: Commercial Managed Care - HMO

## 2015-04-18 ENCOUNTER — Other Ambulatory Visit: Payer: Self-pay

## 2015-04-18 VITALS — BP 162/80 | HR 54 | Temp 98.0°F | Resp 20

## 2015-04-18 DIAGNOSIS — D509 Iron deficiency anemia, unspecified: Secondary | ICD-10-CM

## 2015-04-18 DIAGNOSIS — Z79899 Other long term (current) drug therapy: Secondary | ICD-10-CM | POA: Diagnosis not present

## 2015-04-18 LAB — OCCULT BLOOD X 1 CARD TO LAB, STOOL
FECAL OCCULT BLD: NEGATIVE
FECAL OCCULT BLD: NEGATIVE

## 2015-04-18 MED ORDER — SODIUM CHLORIDE 0.9 % IV SOLN
500.0000 mg | Freq: Once | INTRAVENOUS | Status: AC
  Administered 2015-04-18: 500 mg via INTRAVENOUS
  Filled 2015-04-18: qty 25

## 2015-04-18 MED ORDER — SODIUM CHLORIDE 0.9 % IV SOLN
Freq: Once | INTRAVENOUS | Status: AC
  Administered 2015-04-18: 20 mL/h via INTRAVENOUS
  Filled 2015-04-18: qty 250

## 2015-05-08 DIAGNOSIS — E782 Mixed hyperlipidemia: Secondary | ICD-10-CM | POA: Diagnosis not present

## 2015-05-08 DIAGNOSIS — D508 Other iron deficiency anemias: Secondary | ICD-10-CM | POA: Diagnosis not present

## 2015-05-08 DIAGNOSIS — E1129 Type 2 diabetes mellitus with other diabetic kidney complication: Secondary | ICD-10-CM | POA: Diagnosis not present

## 2015-05-08 DIAGNOSIS — Z0000 Encounter for general adult medical examination without abnormal findings: Secondary | ICD-10-CM | POA: Diagnosis not present

## 2015-05-15 DIAGNOSIS — E538 Deficiency of other specified B group vitamins: Secondary | ICD-10-CM | POA: Diagnosis not present

## 2015-05-15 DIAGNOSIS — Z79899 Other long term (current) drug therapy: Secondary | ICD-10-CM | POA: Diagnosis not present

## 2015-05-15 DIAGNOSIS — E1129 Type 2 diabetes mellitus with other diabetic kidney complication: Secondary | ICD-10-CM | POA: Diagnosis not present

## 2015-05-15 DIAGNOSIS — Z125 Encounter for screening for malignant neoplasm of prostate: Secondary | ICD-10-CM | POA: Diagnosis not present

## 2015-07-02 ENCOUNTER — Other Ambulatory Visit: Payer: Self-pay | Admitting: *Deleted

## 2015-07-02 DIAGNOSIS — D509 Iron deficiency anemia, unspecified: Secondary | ICD-10-CM

## 2015-07-03 ENCOUNTER — Inpatient Hospital Stay: Payer: Commercial Managed Care - HMO | Attending: Internal Medicine

## 2015-07-03 DIAGNOSIS — E039 Hypothyroidism, unspecified: Secondary | ICD-10-CM | POA: Diagnosis not present

## 2015-07-03 DIAGNOSIS — M1990 Unspecified osteoarthritis, unspecified site: Secondary | ICD-10-CM | POA: Diagnosis not present

## 2015-07-03 DIAGNOSIS — Z8673 Personal history of transient ischemic attack (TIA), and cerebral infarction without residual deficits: Secondary | ICD-10-CM | POA: Diagnosis not present

## 2015-07-03 DIAGNOSIS — Z9049 Acquired absence of other specified parts of digestive tract: Secondary | ICD-10-CM | POA: Diagnosis not present

## 2015-07-03 DIAGNOSIS — D509 Iron deficiency anemia, unspecified: Secondary | ICD-10-CM

## 2015-07-03 DIAGNOSIS — L80 Vitiligo: Secondary | ICD-10-CM | POA: Diagnosis not present

## 2015-07-03 DIAGNOSIS — E538 Deficiency of other specified B group vitamins: Secondary | ICD-10-CM | POA: Diagnosis not present

## 2015-07-03 DIAGNOSIS — Z87891 Personal history of nicotine dependence: Secondary | ICD-10-CM | POA: Diagnosis not present

## 2015-07-03 DIAGNOSIS — E119 Type 2 diabetes mellitus without complications: Secondary | ICD-10-CM | POA: Insufficient documentation

## 2015-07-03 DIAGNOSIS — Z79899 Other long term (current) drug therapy: Secondary | ICD-10-CM | POA: Insufficient documentation

## 2015-07-03 DIAGNOSIS — Z7982 Long term (current) use of aspirin: Secondary | ICD-10-CM | POA: Insufficient documentation

## 2015-07-03 DIAGNOSIS — E785 Hyperlipidemia, unspecified: Secondary | ICD-10-CM | POA: Insufficient documentation

## 2015-07-03 LAB — CBC WITH DIFFERENTIAL/PLATELET
BASOS ABS: 0 10*3/uL (ref 0–0.1)
BASOS PCT: 1 %
EOS ABS: 0.3 10*3/uL (ref 0–0.7)
EOS PCT: 4 %
HEMATOCRIT: 40.7 % (ref 40.0–52.0)
Hemoglobin: 13.6 g/dL (ref 13.0–18.0)
Lymphocytes Relative: 21 %
Lymphs Abs: 1.6 10*3/uL (ref 1.0–3.6)
MCH: 28.9 pg (ref 26.0–34.0)
MCHC: 33.4 g/dL (ref 32.0–36.0)
MCV: 86.5 fL (ref 80.0–100.0)
MONO ABS: 0.5 10*3/uL (ref 0.2–1.0)
MONOS PCT: 7 %
NEUTROS PCT: 69 %
Neutro Abs: 5.2 10*3/uL (ref 1.4–6.5)
PLATELETS: 163 10*3/uL (ref 150–440)
RBC: 4.7 MIL/uL (ref 4.40–5.90)
RDW: 18.8 % — AB (ref 11.5–14.5)
WBC: 7.6 10*3/uL (ref 3.8–10.6)

## 2015-07-03 LAB — IRON AND TIBC
IRON: 101 ug/dL (ref 45–182)
SATURATION RATIOS: 26 % (ref 17.9–39.5)
TIBC: 387 ug/dL (ref 250–450)
UIBC: 286 ug/dL

## 2015-07-03 LAB — FERRITIN: Ferritin: 37 ng/mL (ref 24–336)

## 2015-07-04 ENCOUNTER — Inpatient Hospital Stay: Payer: Commercial Managed Care - HMO

## 2015-07-04 ENCOUNTER — Inpatient Hospital Stay (HOSPITAL_BASED_OUTPATIENT_CLINIC_OR_DEPARTMENT_OTHER): Payer: Commercial Managed Care - HMO | Admitting: Internal Medicine

## 2015-07-04 ENCOUNTER — Encounter: Payer: Self-pay | Admitting: Internal Medicine

## 2015-07-04 VITALS — BP 175/66 | HR 52 | Temp 97.5°F | Resp 18 | Ht 66.0 in | Wt 179.9 lb

## 2015-07-04 DIAGNOSIS — D509 Iron deficiency anemia, unspecified: Secondary | ICD-10-CM | POA: Diagnosis not present

## 2015-07-04 DIAGNOSIS — E785 Hyperlipidemia, unspecified: Secondary | ICD-10-CM

## 2015-07-04 DIAGNOSIS — L80 Vitiligo: Secondary | ICD-10-CM

## 2015-07-04 DIAGNOSIS — E039 Hypothyroidism, unspecified: Secondary | ICD-10-CM | POA: Diagnosis not present

## 2015-07-04 DIAGNOSIS — Z8673 Personal history of transient ischemic attack (TIA), and cerebral infarction without residual deficits: Secondary | ICD-10-CM

## 2015-07-04 DIAGNOSIS — Z87891 Personal history of nicotine dependence: Secondary | ICD-10-CM

## 2015-07-04 DIAGNOSIS — Z7982 Long term (current) use of aspirin: Secondary | ICD-10-CM

## 2015-07-04 DIAGNOSIS — Z79899 Other long term (current) drug therapy: Secondary | ICD-10-CM

## 2015-07-04 DIAGNOSIS — M1990 Unspecified osteoarthritis, unspecified site: Secondary | ICD-10-CM

## 2015-07-04 DIAGNOSIS — E538 Deficiency of other specified B group vitamins: Secondary | ICD-10-CM | POA: Diagnosis not present

## 2015-07-04 DIAGNOSIS — E119 Type 2 diabetes mellitus without complications: Secondary | ICD-10-CM | POA: Diagnosis not present

## 2015-07-04 DIAGNOSIS — Z9049 Acquired absence of other specified parts of digestive tract: Secondary | ICD-10-CM

## 2015-07-13 NOTE — Progress Notes (Signed)
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 (Labs done on 03/14/15 showed hemoglobin 10.6, ferritin low at 8. In Feb 2016, Hb 10.4 with ferritin of 7. Hemoglobin was 11.1 in June 2015. Recent stool Hemoccult reportedly negative).  -  patient received parenteral iron therapy (total 1 g IV Venofer in May 2016).  HISTORY OF PRESENT ILLNESS:  Patient returns for continued hematology follow-up, he was seen a few months ago. States that he is doing fairly steady. He denies any new fatigue, dyspnea, orthopnea, chest pain or PND. Denies any obvious bleeding symptoms. Appetite is steady. No new bone pains.  REVIEW OF SYSTEMS:   ROS As in HPI above. In addition, no fever, chills or sweats. No new headaches or focal weakness.  No abdominal pain, constipation, diarrhea, dysuria or hematuria. No new skin rash or bleeding symptoms. No new paresthesias in extremities.   PAST MEDICAL HISTORY: Reviewed. Past Medical History  Diagnosis Date  . Diabetes mellitus   . Stroke   . 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 Past Surgical History  Procedure Laterality Date  . Cholecystectomy    . Nose surgery    . Carotid endarterectomy    . Cervical spine surgery      FAMILY HISTORY:  Reviewed. History reviewed. No pertinent family history.  SOCIAL HISTORY: Reviewed. Social History  Substance Use Topics  . Smoking status: Former Smoker -- 1.00 packs/day for 35 years    Types: Pipe, Cigars    Quit date: 08/30/1999  . Smokeless tobacco: Never Used  . Alcohol Use: No    Allergies  Allergen Reactions  . Sulfa Antibiotics Itching and Rash    Current Outpatient Prescriptions  Medication Sig Dispense Refill  . aspirin EC 81 MG tablet Take by mouth.    . baclofen (LIORESAL) 20 MG tablet Take by mouth.    . citalopram (CELEXA) 20 MG tablet Take by mouth.    . Cyanocobalamin (RA VITAMIN B-12 TR) 1000 MCG TBCR Take by mouth.    . gabapentin (NEURONTIN) 300 MG capsule Take by mouth.    Marland Kitchen glimepiride (AMARYL) 2 MG tablet Take by mouth.    . levothyroxine (SYNTHROID, LEVOTHROID) 100 MCG tablet Take by mouth.    Marland Kitchen lisinopril (PRINIVIL,ZESTRIL) 10 MG tablet Take by mouth.    . Melatonin (MELATONIN MAXIMUM STRENGTH) 5 MG TABS Take by mouth.    . metFORMIN (GLUCOPHAGE) 500 MG tablet Take by mouth.    Marland Kitchen omeprazole (PRILOSEC) 40 MG capsule TAKE 1 CAPSULE (40 MG TOTAL) BY MOUTH DAILY.    Marland Kitchen ReliOn Ultra Thin Lancets MISC Use 1 each 2 (two) times daily. Fastclix lancets    . simvastatin (ZOCOR) 40 MG tablet Take by mouth.     No current facility-administered medications for this visit.    PHYSICAL EXAM: Filed Vitals:   07/04/15 1359  BP: 175/66  Pulse: 52  Temp: 97.5 F (36.4 C)  Resp: 18     Body mass index is 29.05 kg/(m^2).    GENERAL: Patient is alert and oriented and in no acute distress. There is no icterus or pallor. HEENT: EOMs intact. Oral exam negative for thrush or lesions. No cervical lymphadenopathy. CVS: S1S2, regular LUNGS: Bilaterally clear to auscultation, no rhonchi. ABDOMEN: Soft, nontender. No hepatosplenomegaly clinically.  EXTREMITIES: No pedal edema.   LAB RESULTS: Hemoglobin 13.6, MCV 86.5, WBC 7.6, platelets 163, serum iron 101, TIBC 387,  ferritin 37, iron saturation 26%. Lab Results  Component Value Date   WBC 7.6 07/03/2015   NEUTROABS 5.2 07/03/2015   HGB 13.6 07/03/2015   HCT 40.7 07/03/2015   MCV 86.5 07/03/2015   PLT 163 07/03/2015    Lab Results  Component Value Date   IRON 101 07/03/2015    STUDIES: 03/14/15 - Hb 10.6, ferritin low at 8.  Feb 2016 - Hb 10.4 with ferritin of 7.  June 2015 - Hb was 11.1.  Prior stool Hemoccult reportedly negative.   ASSESSMENT / PLAN:   Iron deficiency Anemia unresponsive to oral iron therapy (on oral ferrous sulfate twice daily for >2 months).  Labs done on 03/14/15 showed Hb 10.6, ferritin low at 8. In Feb 2016, Hb 10.4 with ferritin of 7. Hemoglobin was 11.1 in June 2015. Recent stool Hemoccult reportedly negative  -  reviewed labs from today and discussed with patient. Hemoglobin has normalized, iron study also shows normal serum iron, ferritin in the low normal range and iron saturation normal at 26%. Do not see the need for continued parenteral iron therapy at this time. Have discussed with the patient, he will try taking oral ferrous sulfate 225 mg once daily if he does not develop constipation or other side effects. Otherwise plan is continued monitoring, will monitor CBC and iron study q16 weeks. Next MD follow-up at 48 weeks with repeat labs and make further plan of management. Have explained to patient that in between if labs shows recurrent iron deficiency anemia, we will then need to resume on parenteral iron therapy.   In between visits, the patient has been advised to call or come to ER in case of acute sickness or new symptoms. He is agreeable to this plan.      Leia Alf, MD   07/13/2015 9:55 PM

## 2015-09-05 DIAGNOSIS — E113293 Type 2 diabetes mellitus with mild nonproliferative diabetic retinopathy without macular edema, bilateral: Secondary | ICD-10-CM | POA: Diagnosis not present

## 2015-09-05 DIAGNOSIS — H2513 Age-related nuclear cataract, bilateral: Secondary | ICD-10-CM | POA: Diagnosis not present

## 2015-09-05 DIAGNOSIS — H524 Presbyopia: Secondary | ICD-10-CM | POA: Diagnosis not present

## 2015-10-22 ENCOUNTER — Inpatient Hospital Stay: Payer: Commercial Managed Care - HMO

## 2015-10-24 ENCOUNTER — Inpatient Hospital Stay: Payer: Commercial Managed Care - HMO

## 2015-10-30 ENCOUNTER — Other Ambulatory Visit: Payer: Commercial Managed Care - HMO

## 2015-10-31 ENCOUNTER — Inpatient Hospital Stay: Payer: Commercial Managed Care - HMO | Attending: Internal Medicine

## 2015-10-31 DIAGNOSIS — D509 Iron deficiency anemia, unspecified: Secondary | ICD-10-CM | POA: Insufficient documentation

## 2015-10-31 LAB — CBC WITH DIFFERENTIAL/PLATELET
BASOS PCT: 1 %
Basophils Absolute: 0.1 10*3/uL (ref 0–0.1)
EOS ABS: 0.4 10*3/uL (ref 0–0.7)
Eosinophils Relative: 4 %
HCT: 41.9 % (ref 40.0–52.0)
Hemoglobin: 14.1 g/dL (ref 13.0–18.0)
Lymphocytes Relative: 22 %
Lymphs Abs: 2 10*3/uL (ref 1.0–3.6)
MCH: 30.2 pg (ref 26.0–34.0)
MCHC: 33.6 g/dL (ref 32.0–36.0)
MCV: 89.8 fL (ref 80.0–100.0)
MONO ABS: 0.5 10*3/uL (ref 0.2–1.0)
MONOS PCT: 5 %
Neutro Abs: 6 10*3/uL (ref 1.4–6.5)
Neutrophils Relative %: 68 %
Platelets: 198 10*3/uL (ref 150–440)
RBC: 4.66 MIL/uL (ref 4.40–5.90)
RDW: 13.1 % (ref 11.5–14.5)
WBC: 9 10*3/uL (ref 3.8–10.6)

## 2015-10-31 LAB — FERRITIN: FERRITIN: 23 ng/mL — AB (ref 24–336)

## 2015-10-31 LAB — IRON AND TIBC
Iron: 98 ug/dL (ref 45–182)
SATURATION RATIOS: 25 % (ref 17.9–39.5)
TIBC: 388 ug/dL (ref 250–450)
UIBC: 290 ug/dL

## 2015-11-13 DIAGNOSIS — E538 Deficiency of other specified B group vitamins: Secondary | ICD-10-CM | POA: Diagnosis not present

## 2015-11-13 DIAGNOSIS — Z79899 Other long term (current) drug therapy: Secondary | ICD-10-CM | POA: Diagnosis not present

## 2015-11-13 DIAGNOSIS — Z125 Encounter for screening for malignant neoplasm of prostate: Secondary | ICD-10-CM | POA: Diagnosis not present

## 2015-11-13 DIAGNOSIS — E1129 Type 2 diabetes mellitus with other diabetic kidney complication: Secondary | ICD-10-CM | POA: Diagnosis not present

## 2015-11-21 DIAGNOSIS — R1312 Dysphagia, oropharyngeal phase: Secondary | ICD-10-CM | POA: Diagnosis not present

## 2015-11-21 DIAGNOSIS — Z0000 Encounter for general adult medical examination without abnormal findings: Secondary | ICD-10-CM | POA: Diagnosis not present

## 2015-12-25 ENCOUNTER — Other Ambulatory Visit: Payer: Self-pay | Admitting: Physician Assistant

## 2015-12-25 DIAGNOSIS — R1319 Other dysphagia: Secondary | ICD-10-CM | POA: Diagnosis not present

## 2015-12-25 DIAGNOSIS — R1310 Dysphagia, unspecified: Secondary | ICD-10-CM

## 2015-12-25 DIAGNOSIS — Z86010 Personal history of colonic polyps: Secondary | ICD-10-CM | POA: Diagnosis not present

## 2016-01-01 ENCOUNTER — Ambulatory Visit
Admission: RE | Admit: 2016-01-01 | Discharge: 2016-01-01 | Disposition: A | Discharge status: Home / Self Care | Payer: Commercial Managed Care - HMO | Source: Ambulatory Visit | Attending: Physician Assistant | Admitting: Physician Assistant

## 2016-01-01 DIAGNOSIS — K219 Gastro-esophageal reflux disease without esophagitis: Secondary | ICD-10-CM | POA: Diagnosis not present

## 2016-01-01 DIAGNOSIS — R1310 Dysphagia, unspecified: Secondary | ICD-10-CM | POA: Diagnosis not present

## 2016-01-01 DIAGNOSIS — K228 Other specified diseases of esophagus: Secondary | ICD-10-CM | POA: Insufficient documentation

## 2016-01-01 DIAGNOSIS — K449 Diaphragmatic hernia without obstruction or gangrene: Secondary | ICD-10-CM | POA: Insufficient documentation

## 2016-01-01 IMAGING — RF DG ESOPHAGUS
11 of 14 series · 15 of 20 positions shown · non-contrast
Comparison: CT [DATE].

CLINICAL DATA: Dysphagia.

EXAM:
ESOPHOGRAM / BARIUM SWALLOW / BARIUM TABLET STUDY
TECHNIQUE: Combined double contrast and single contrast examination performed
using effervescent crystals, thick barium liquid, and thin barium
liquid. The patient was observed with fluoroscopy swallowing a 13 mm
barium sulphate tablet.
FLUOROSCOPY TIME:  Radiation Exposure Index (as provided by the
fluoroscopic device):

[Series 1: fluoro_barium 2fps_bw · 0.18mm/px · 3 of 8 frames shown (1 of 11)]
[frame 2/8]
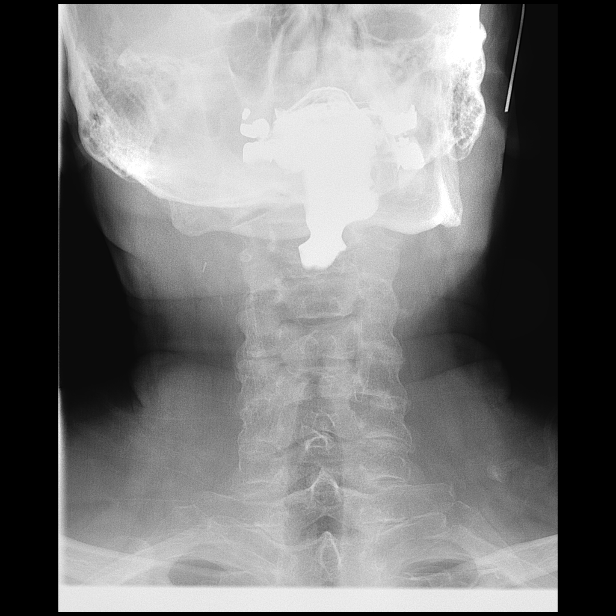
[frame 5/8]
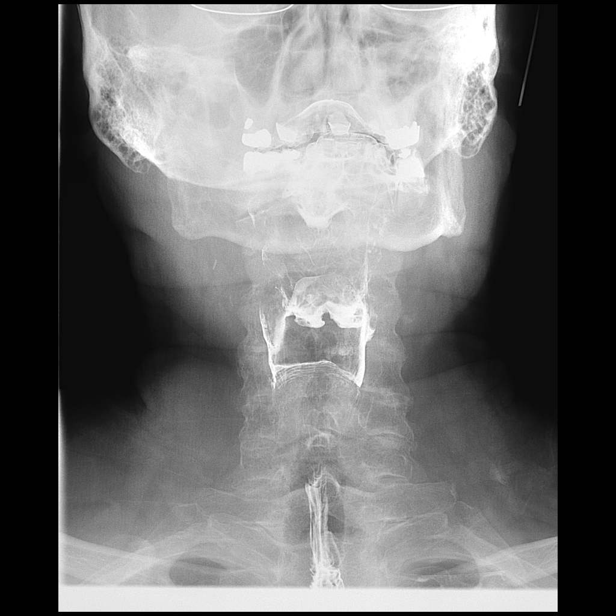
[frame 7/8]
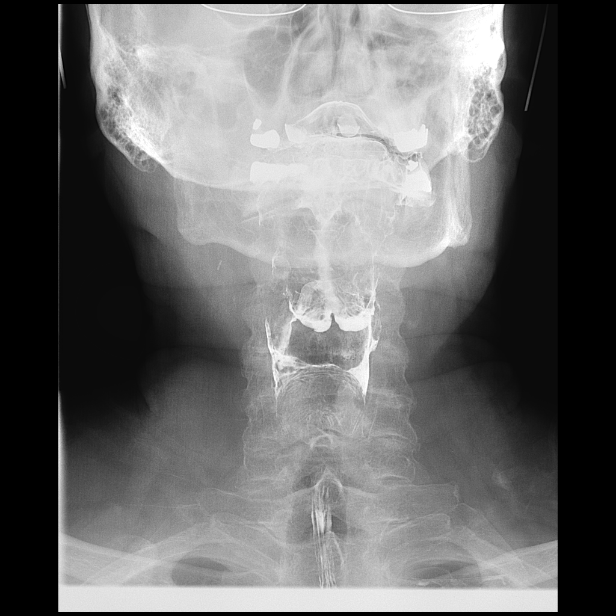

[Series 2: fluoro_barium 2fps_bw · 0.19mm/px · 3 of 5 frames shown (2 of 11)]
[frame 1/5]
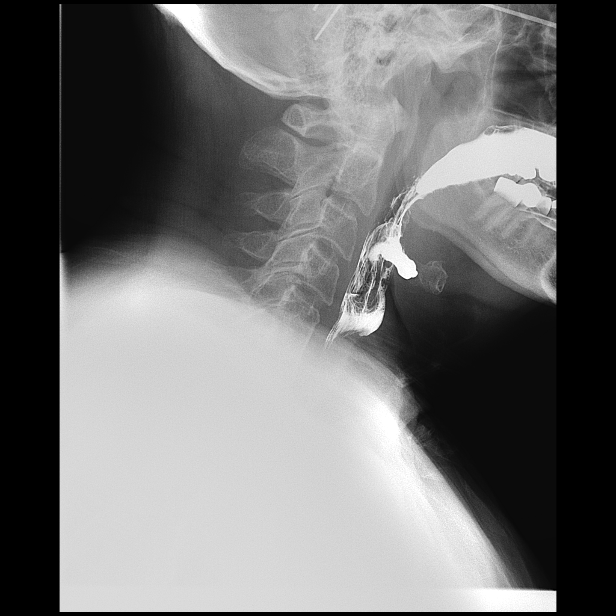
[frame 3/5]
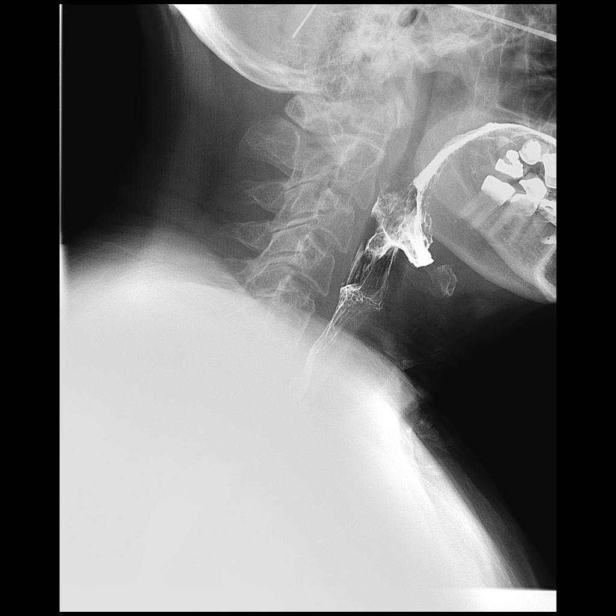
[frame 5/5]
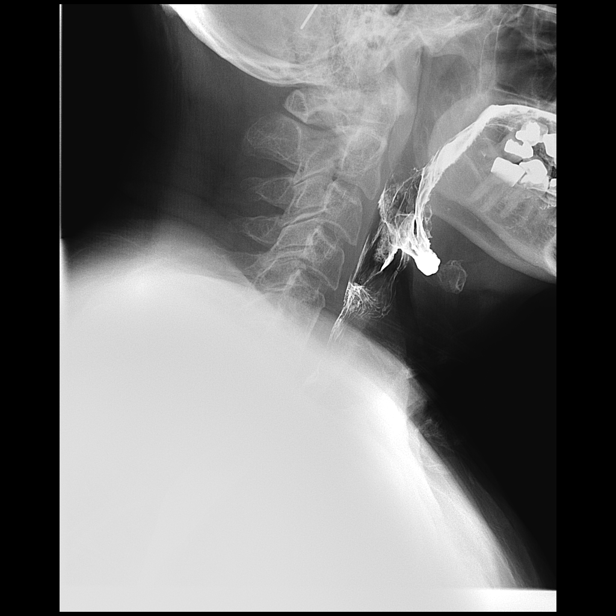

[Series 3: fluoro_barium 2fps_bw · 0.19mm/px · 1 of 1 slices shown (3 of 11)]
[im 1/1]
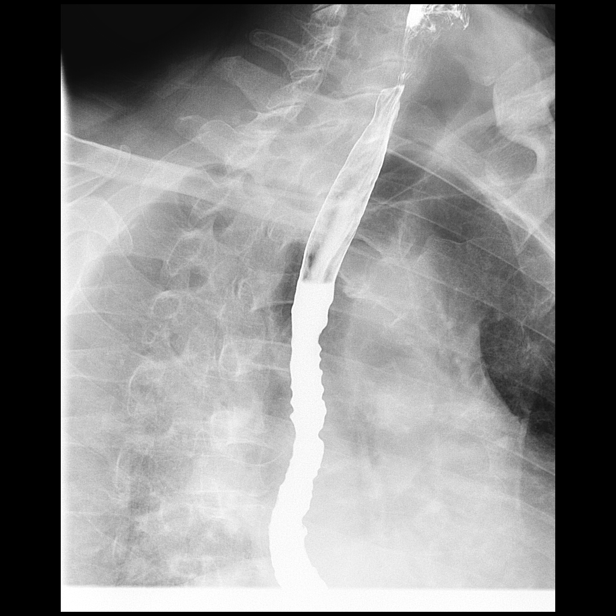

[Series 5: fluoro_barium 2fps_bw · 0.19mm/px · 1 of 1 slices shown (4 of 11)]
[im 1/1]
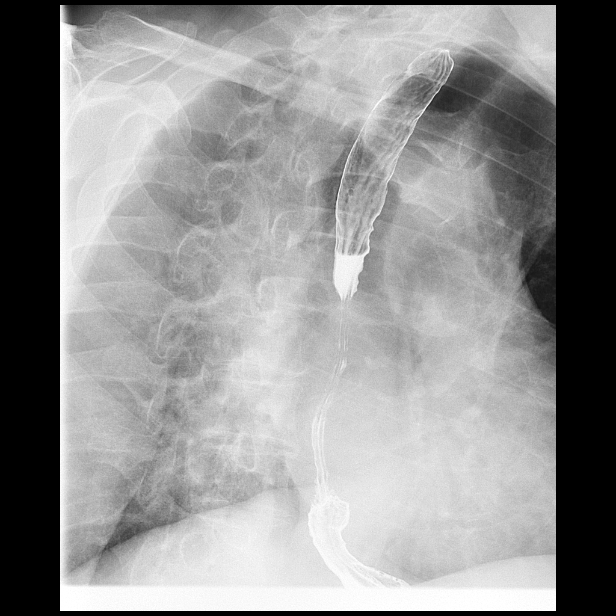

[Series 6: fluoro_barium 2fps_bw · 0.19mm/px · 1 of 1 slices shown (5 of 11)]
[im 1/1]
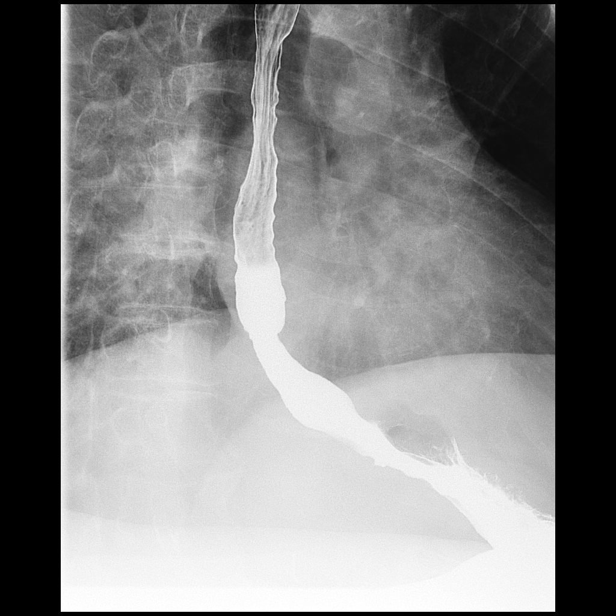

[Series 7: fluoro_barium 2fps_bw · 0.19mm/px · 1 of 1 slices shown (6 of 11)]
[im 1/1]
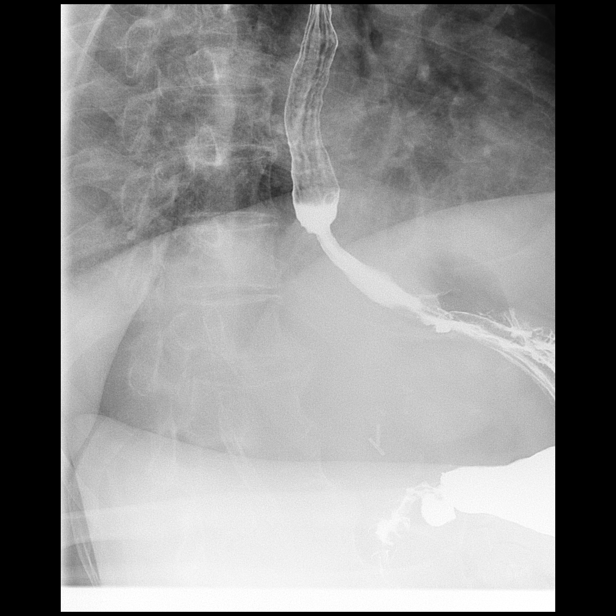

[Series 9: fluoro_barium 2fps_bw · 0.19mm/px · 1 of 1 slices shown (7 of 11)]
[im 1/1]
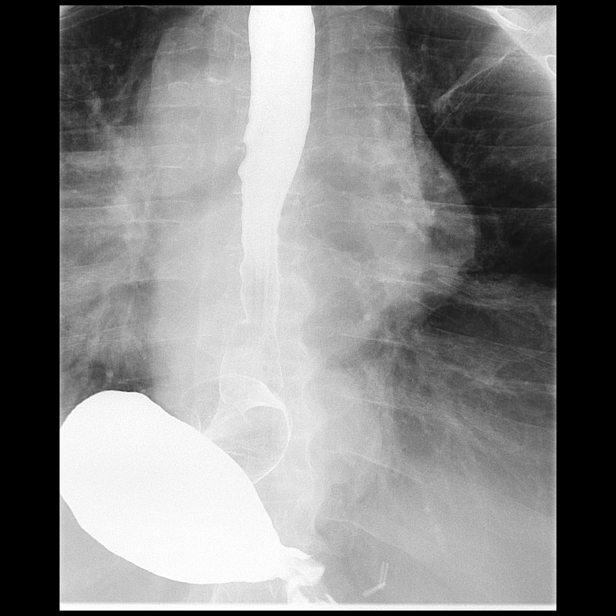

[Series 10: fluoro_barium 2fps_bw · 0.19mm/px · 1 of 1 slices shown (8 of 11)]
[im 1/1]
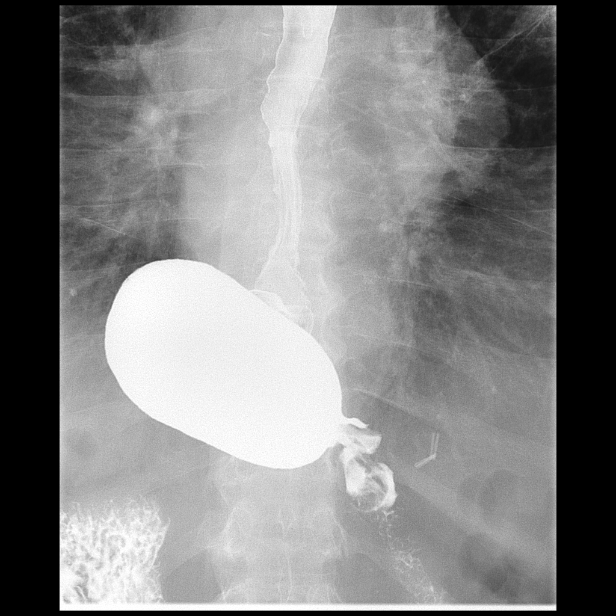

[Series 11: fluoro_barium 2fps_bw · 0.19mm/px · 1 of 1 slices shown (9 of 11)]
[im 1/1]
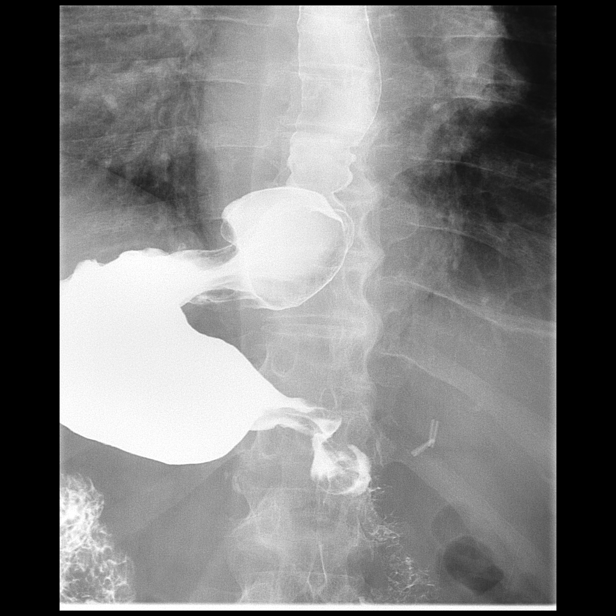

[Series 13: fluoro_barium 2fps_bw · 0.17mm/px · 1 of 1 slices shown (10 of 11)]
[im 1/1]
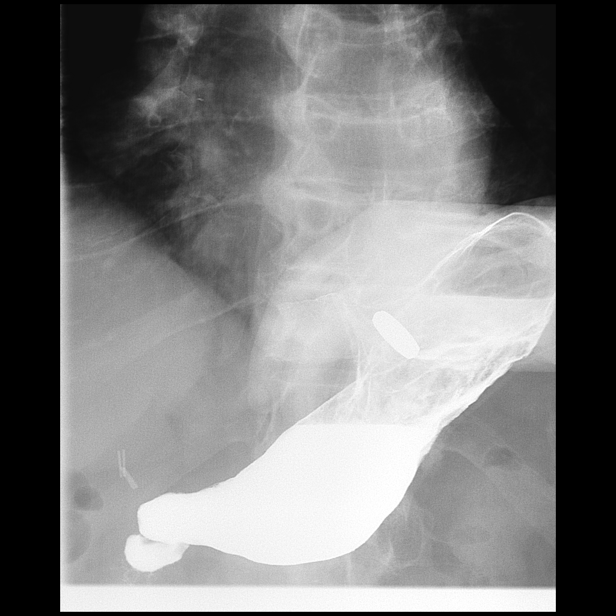

[Series 14: fluoro_barium 2fps_bw · 0.17mm/px · 1 of 1 slices shown (11 of 11)]
[im 1/1]
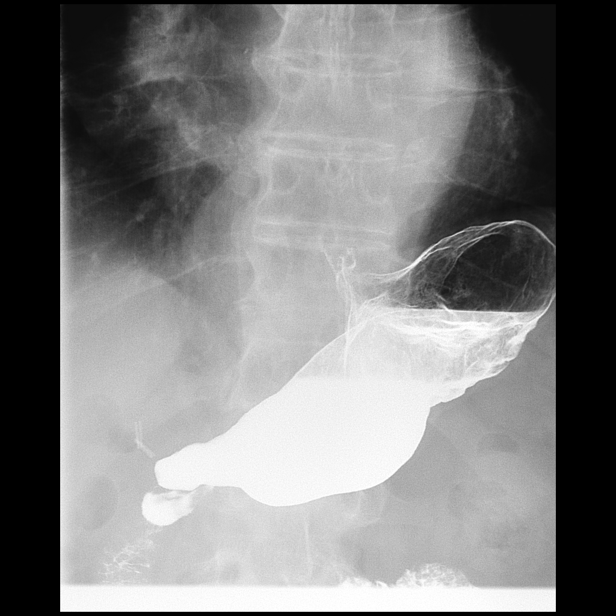

[15 of 20 positions shown; findings below may reference images not displayed]

FINDINGS: Cervical esophagus appears normal. Tertiary esophageal contractions
are present consistent presbyesophagus. No obstructing abnormality
identified. Small hiatal hernia with moderate gastroesophageal
reflux noted.
IMPRESSION: 1. Small hiatal hernia with moderate gastroesophageal reflux.
2. Presbyesophagus.  No obstructing abnormality.

## 2016-01-29 ENCOUNTER — Encounter: Payer: Self-pay | Admitting: *Deleted

## 2016-01-30 ENCOUNTER — Ambulatory Visit: Payer: Commercial Managed Care - HMO | Admitting: Anesthesiology

## 2016-01-30 ENCOUNTER — Ambulatory Visit
Admission: RE | Admit: 2016-01-30 | Discharge: 2016-01-30 | Disposition: A | Discharge status: Home / Self Care | Payer: Commercial Managed Care - HMO | Source: Ambulatory Visit | Attending: Unknown Physician Specialty | Admitting: Unknown Physician Specialty

## 2016-01-30 ENCOUNTER — Encounter
Admission: RE | Disposition: A | Discharge status: Home / Self Care | Payer: Self-pay | Source: Ambulatory Visit | Attending: Unknown Physician Specialty

## 2016-01-30 DIAGNOSIS — K640 First degree hemorrhoids: Secondary | ICD-10-CM | POA: Insufficient documentation

## 2016-01-30 DIAGNOSIS — Z86010 Personal history of colonic polyps: Secondary | ICD-10-CM | POA: Diagnosis not present

## 2016-01-30 DIAGNOSIS — D120 Benign neoplasm of cecum: Secondary | ICD-10-CM | POA: Insufficient documentation

## 2016-01-30 DIAGNOSIS — Z8673 Personal history of transient ischemic attack (TIA), and cerebral infarction without residual deficits: Secondary | ICD-10-CM | POA: Diagnosis not present

## 2016-01-30 DIAGNOSIS — E119 Type 2 diabetes mellitus without complications: Secondary | ICD-10-CM | POA: Insufficient documentation

## 2016-01-30 DIAGNOSIS — K5730 Diverticulosis of large intestine without perforation or abscess without bleeding: Secondary | ICD-10-CM | POA: Insufficient documentation

## 2016-01-30 DIAGNOSIS — Z79899 Other long term (current) drug therapy: Secondary | ICD-10-CM | POA: Diagnosis not present

## 2016-01-30 DIAGNOSIS — D122 Benign neoplasm of ascending colon: Secondary | ICD-10-CM | POA: Insufficient documentation

## 2016-01-30 DIAGNOSIS — D126 Benign neoplasm of colon, unspecified: Secondary | ICD-10-CM | POA: Diagnosis not present

## 2016-01-30 DIAGNOSIS — Z1211 Encounter for screening for malignant neoplasm of colon: Secondary | ICD-10-CM | POA: Insufficient documentation

## 2016-01-30 DIAGNOSIS — K621 Rectal polyp: Secondary | ICD-10-CM | POA: Diagnosis not present

## 2016-01-30 DIAGNOSIS — E538 Deficiency of other specified B group vitamins: Secondary | ICD-10-CM | POA: Insufficient documentation

## 2016-01-30 DIAGNOSIS — Z7984 Long term (current) use of oral hypoglycemic drugs: Secondary | ICD-10-CM | POA: Insufficient documentation

## 2016-01-30 DIAGNOSIS — I499 Cardiac arrhythmia, unspecified: Secondary | ICD-10-CM | POA: Diagnosis not present

## 2016-01-30 DIAGNOSIS — K635 Polyp of colon: Secondary | ICD-10-CM | POA: Diagnosis not present

## 2016-01-30 DIAGNOSIS — Z9049 Acquired absence of other specified parts of digestive tract: Secondary | ICD-10-CM | POA: Insufficient documentation

## 2016-01-30 DIAGNOSIS — M1990 Unspecified osteoarthritis, unspecified site: Secondary | ICD-10-CM | POA: Diagnosis not present

## 2016-01-30 DIAGNOSIS — K5790 Diverticulosis of intestine, part unspecified, without perforation or abscess without bleeding: Secondary | ICD-10-CM | POA: Diagnosis not present

## 2016-01-30 DIAGNOSIS — E039 Hypothyroidism, unspecified: Secondary | ICD-10-CM | POA: Insufficient documentation

## 2016-01-30 DIAGNOSIS — E785 Hyperlipidemia, unspecified: Secondary | ICD-10-CM | POA: Diagnosis not present

## 2016-01-30 DIAGNOSIS — Z7982 Long term (current) use of aspirin: Secondary | ICD-10-CM | POA: Diagnosis not present

## 2016-01-30 DIAGNOSIS — D509 Iron deficiency anemia, unspecified: Secondary | ICD-10-CM | POA: Diagnosis not present

## 2016-01-30 DIAGNOSIS — L80 Vitiligo: Secondary | ICD-10-CM | POA: Insufficient documentation

## 2016-01-30 DIAGNOSIS — Z882 Allergy status to sulfonamides status: Secondary | ICD-10-CM | POA: Diagnosis not present

## 2016-01-30 DIAGNOSIS — Z87891 Personal history of nicotine dependence: Secondary | ICD-10-CM | POA: Diagnosis not present

## 2016-01-30 DIAGNOSIS — I517 Cardiomegaly: Secondary | ICD-10-CM | POA: Insufficient documentation

## 2016-01-30 DIAGNOSIS — K648 Other hemorrhoids: Secondary | ICD-10-CM | POA: Diagnosis not present

## 2016-01-30 HISTORY — DX: Type 2 diabetes mellitus with other diabetic kidney complication: E11.29

## 2016-01-30 HISTORY — DX: Hypothyroidism, unspecified: E03.9

## 2016-01-30 HISTORY — DX: Hematuria, unspecified: R31.9

## 2016-01-30 HISTORY — DX: Hyperlipidemia, unspecified: E78.5

## 2016-01-30 HISTORY — DX: Vitiligo: L80

## 2016-01-30 HISTORY — PX: COLONOSCOPY WITH PROPOFOL: SHX5780

## 2016-01-30 HISTORY — DX: Deficiency of other specified B group vitamins: E53.8

## 2016-01-30 HISTORY — DX: Cerebral infarction, unspecified: I63.9

## 2016-01-30 HISTORY — DX: Unspecified osteoarthritis, unspecified site: M19.90

## 2016-01-30 LAB — GLUCOSE, CAPILLARY: GLUCOSE-CAPILLARY: 148 mg/dL — AB (ref 65–99)

## 2016-01-30 SURGERY — COLONOSCOPY WITH PROPOFOL
Anesthesia: General

## 2016-01-30 MED ORDER — SODIUM CHLORIDE 0.9 % IV SOLN
INTRAVENOUS | Status: DC
  Administered 2016-01-30: 1000 mL via INTRAVENOUS

## 2016-01-30 MED ORDER — SODIUM CHLORIDE 0.9 % IV SOLN: INTRAVENOUS | Status: DC

## 2016-01-30 MED ORDER — LIDOCAINE HCL (PF) 2 % IJ SOLN
INTRAMUSCULAR | Status: DC | PRN
  Administered 2016-01-30: 60 mg

## 2016-01-30 MED ORDER — FENTANYL CITRATE (PF) 100 MCG/2ML IJ SOLN
INTRAMUSCULAR | Status: DC | PRN
  Administered 2016-01-30: 50 ug via INTRAVENOUS

## 2016-01-30 MED ORDER — EPHEDRINE SULFATE 50 MG/ML IJ SOLN
INTRAMUSCULAR | Status: DC | PRN
  Administered 2016-01-30: 10 mg via INTRAVENOUS
  Administered 2016-01-30: 5 mg via INTRAVENOUS

## 2016-01-30 MED ORDER — PROPOFOL 500 MG/50ML IV EMUL
INTRAVENOUS | Status: DC | PRN
  Administered 2016-01-30: 75 ug/kg/min via INTRAVENOUS

## 2016-01-30 MED ORDER — PROPOFOL 10 MG/ML IV BOLUS
INTRAVENOUS | Status: DC | PRN
  Administered 2016-01-30: 10 mg via INTRAVENOUS
  Administered 2016-01-30: 30 mg via INTRAVENOUS
  Administered 2016-01-30: 10 mg via INTRAVENOUS

## 2016-01-30 NOTE — Anesthesia Postprocedure Evaluation (Signed)
Anesthesia Post Note  Patient: Eric Lopez  Procedure(s) Performed: Procedure(s) (LRB): COLONOSCOPY WITH PROPOFOL (N/A)  Patient location during evaluation: Endoscopy Anesthesia Type: General Level of consciousness: awake and alert Pain management: pain level controlled Vital Signs Assessment: post-procedure vital signs reviewed and stable Respiratory status: spontaneous breathing, nonlabored ventilation, respiratory function stable and patient connected to nasal cannula oxygen Cardiovascular status: blood pressure returned to baseline and stable Postop Assessment: no signs of nausea or vomiting Anesthetic complications: no    Last Vitals:  Filed Vitals:   01/30/16 0823 01/30/16 0833  BP: 156/85 157/78  Pulse: 54 59  Temp:    Resp: 16 19    Last Pain: There were no vitals filed for this visit.               Precious Haws Tameka Hoiland

## 2016-01-30 NOTE — Op Note (Signed)
[facility] Patient Name: Eric Lopez Procedure Date: 01/30/2016 7:30 AM MRN: [id number] Account #: [id number] Date of Birth: 05/08/1941 Admit Type: Outpatient Age: 75 Room: [facility] ENDO ROOM 1 Gender: Male Note Status: Finalized Procedure:            Colonoscopy Indications:          High risk colon cancer surveillance: Personal history                        of colonic polyps Providers:            Manya Silvas, MD Referring MD:         Rusty Aus, MD (Referring MD) Medicines:            Propofol per Anesthesia Complications:        No immediate complications. Procedure:            Pre-Anesthesia Assessment:                       - After reviewing the risks and benefits, the patient                        was deemed in satisfactory condition to undergo the                        procedure.                       After obtaining informed consent, the colonoscope was                        passed under direct vision. Throughout the procedure,                        the patient's blood pressure, pulse, and oxygen                        saturations were monitored continuously. The                        Colonoscope was introduced through the anus and                        advanced to the the cecum, identified by appendiceal                        orifice and ileocecal valve. The colonoscopy was                        performed without difficulty. The patient tolerated the                        procedure well. The quality of the bowel preparation                        was good. Findings:      A diminutive polyp was found in the rectum. The polyp was sessile. The       polyp was removed with a cold biopsy forceps. Resection and retrieval       were complete.      Two sessile polyps were found  in the ascending colon. The polyps were       small in size. These polyps were removed with a hot snare. Resection and       retrieval were  complete.      A small polyp was found in the cecum. The polyp was sessile. The polyp       was removed with a hot snare. Resection and retrieval were complete.      Multiple small-mouthed diverticula were found in the sigmoid colon,       descending colon, transverse colon and ascending colon.      Internal hemorrhoids were found during endoscopy. The hemorrhoids were       small and Grade I (internal hemorrhoids that do not prolapse).      The exam was otherwise without abnormality. Impression:           - One diminutive polyp in the rectum, removed with a                        cold biopsy forceps. Resected and retrieved.                       - Two small polyps in the ascending colon, removed with                        a hot snare. Resected and retrieved.                       - One small polyp in the cecum, removed with a hot                        snare. Resected and retrieved.                       - Diverticulosis in the sigmoid colon, in the                        descending colon, in the transverse colon and in the                        ascending colon.                       - Internal hemorrhoids.                       - The examination was otherwise normal. Recommendation:       - Await pathology results. Manya Silvas, MD 01/30/2016 8:02:58 AM This report has been signed electronically. Number of Addenda: 0 Note Initiated On: 01/30/2016 7:30 AM Scope Withdrawal Time: 0 hours 15 minutes 32 seconds  Total Procedure Duration: 0 hours 20 minutes 33 seconds       [facility]

## 2016-01-30 NOTE — H&P (Signed)
Primary Care Physician:  Rusty Aus, MD Primary Gastroenterologist:  Dr. Vira Agar  Pre-Procedure History & Physical: HPI:  Eric Lopez is a 75 y.o. male is here for an colonoscopy.   Past Medical History  Diagnosis Date  . Stroke ([place])   . IDA (iron deficiency anemia) 04/04/2015  . B12 deficiency   . CVA (cerebral infarction)     left hemiparesis  . Diabetes mellitus   . Type 2 diabetes mellitus with renal manifestations, controlled ([place])   . Hematuria   . Hyperlipidemia   . Hypothyroidism   . Osteoarthritis   . Vitiligo     Past Surgical History  Procedure Laterality Date  . Cholecystectomy    . Nose surgery    . Carotid endarterectomy    . Cervical spine surgery    . Laminotomy / excision disk posterior cervical spine      Prior to Admission medications   Medication Sig Start Date End Date Taking? Authorizing Provider  aspirin EC 81 MG tablet Take by mouth.    Historical Provider, MD  baclofen (LIORESAL) 20 MG tablet Take by mouth. 11/14/14   Historical Provider, MD  citalopram (CELEXA) 20 MG tablet Take by mouth. 11/14/14   Historical Provider, MD  Cyanocobalamin (RA VITAMIN B-12 TR) 1000 MCG TBCR Take by mouth.    Historical Provider, MD  gabapentin (NEURONTIN) 300 MG capsule Take by mouth. 11/14/14   Historical Provider, MD  glimepiride (AMARYL) 2 MG tablet Take by mouth. 11/14/14   Historical Provider, MD  levothyroxine (SYNTHROID, LEVOTHROID) 100 MCG tablet Take by mouth. 11/14/14   Historical Provider, MD  lisinopril (PRINIVIL,ZESTRIL) 10 MG tablet Take by mouth. 11/14/14   Historical Provider, MD  Melatonin (MELATONIN MAXIMUM STRENGTH) 5 MG TABS Take by mouth.    Historical Provider, MD  metFORMIN (GLUCOPHAGE) 500 MG tablet Take by mouth. 11/14/14   Historical Provider, MD  omeprazole (PRILOSEC) 40 MG capsule TAKE 1 CAPSULE (40 MG TOTAL) BY MOUTH DAILY. 04/17/15   Historical Provider, MD  simvastatin (ZOCOR) 40 MG tablet Take by mouth. 11/14/14    Historical Provider, MD    Allergies as of 01/23/2016 - Review Complete 08/30/2011  Allergen Reaction Noted  . Sulfa antibiotics Itching and Rash 08/23/2011    History reviewed. No pertinent family history.  Social History   Social History  . Marital Status: Married    Spouse Name: N/A  . Number of Children: N/A  . Years of Education: N/A   Occupational History  . Not on file.   Social History Main Topics  . Smoking status: Former Smoker -- 1.00 packs/day for 35 years    Types: Pipe, Cigars    Quit date: 08/30/1999  . Smokeless tobacco: Never Used  . Alcohol Use: No  . Drug Use: No  . Sexual Activity: Yes    Birth Control/ Protection: None   Other Topics Concern  . Not on file   Social History Narrative    Review of Systems: See HPI, otherwise negative ROS  Physical Exam: BP 138/96 mmHg  Pulse 56  Temp(Src) 96.8 F (36 C) (Tympanic)  Resp 17  Ht 5\' 6"  (1.676 m)  Wt 79.833 kg (176 lb)  BMI 28.42 kg/m2  SpO2 98% General:   Alert,  pleasant and cooperative in NAD Head:  Normocephalic and atraumatic. Neck:  Supple; no masses or thyromegaly. Lungs:  Clear throughout to auscultation.    Heart:  Regular rate and rhythm. Abdomen:  Soft, nontender and nondistended. Normal  bowel sounds, without guarding, and without rebound.   Neurologic:  Alert and  oriented x4;  grossly normal neurologically.  Impression/Plan: [name] is here for an colonoscopy to be performed for [facility] colon polyps  Risks, benefits, limitations, and alternatives regarding  colonoscopy have been reviewed with the patient.  Questions have been answered.  All parties agreeable.   Gaylyn Cheers, MD  01/30/2016, 7:35 AM    Primary Care Physician:  Rusty Aus, MD Primary Gastroenterologist:  Dr. Vira Agar  Pre-Procedure History & Physical: HPI:  Eric Lopez is a 75 y.o. male is here for an colonoscopy.   Past Medical History  Diagnosis Date  . Stroke ([place])   . IDA (iron  deficiency anemia) 04/04/2015  . B12 deficiency   . CVA (cerebral infarction)     left hemiparesis  . Diabetes mellitus   . Type 2 diabetes mellitus with renal manifestations, controlled ([place])   . Hematuria   . Hyperlipidemia   . Hypothyroidism   . Osteoarthritis   . Vitiligo     Past Surgical History  Procedure Laterality Date  . Cholecystectomy    . Nose surgery    . Carotid endarterectomy    . Cervical spine surgery    . Laminotomy / excision disk posterior cervical spine      Prior to Admission medications   Medication Sig Start Date End Date Taking? Authorizing Provider  aspirin EC 81 MG tablet Take by mouth.    Historical Provider, MD  baclofen (LIORESAL) 20 MG tablet Take by mouth. 11/14/14   Historical Provider, MD  citalopram (CELEXA) 20 MG tablet Take by mouth. 11/14/14   Historical Provider, MD  Cyanocobalamin (RA VITAMIN B-12 TR) 1000 MCG TBCR Take by mouth.    Historical Provider, MD  gabapentin (NEURONTIN) 300 MG capsule Take by mouth. 11/14/14   Historical Provider, MD  glimepiride (AMARYL) 2 MG tablet Take by mouth. 11/14/14   Historical Provider, MD  levothyroxine (SYNTHROID, LEVOTHROID) 100 MCG tablet Take by mouth. 11/14/14   Historical Provider, MD  lisinopril (PRINIVIL,ZESTRIL) 10 MG tablet Take by mouth. 11/14/14   Historical Provider, MD  Melatonin (MELATONIN MAXIMUM STRENGTH) 5 MG TABS Take by mouth.    Historical Provider, MD  metFORMIN (GLUCOPHAGE) 500 MG tablet Take by mouth. 11/14/14   Historical Provider, MD  omeprazole (PRILOSEC) 40 MG capsule TAKE 1 CAPSULE (40 MG TOTAL) BY MOUTH DAILY. 04/17/15   Historical Provider, MD  simvastatin (ZOCOR) 40 MG tablet Take by mouth. 11/14/14   Historical Provider, MD    Allergies as of 01/23/2016 - Review Complete 08/30/2011  Allergen Reaction Noted  . Sulfa antibiotics Itching and Rash 08/23/2011    History reviewed. No pertinent family history.  Social History   Social History  . Marital Status: Married     Spouse Name: N/A  . Number of Children: N/A  . Years of Education: N/A   Occupational History  . Not on file.   Social History Main Topics  . Smoking status: Former Smoker -- 1.00 packs/day for 35 years    Types: Pipe, Cigars    Quit date: 08/30/1999  . Smokeless tobacco: Never Used  . Alcohol Use: No  . Drug Use: No  . Sexual Activity: Yes    Birth Control/ Protection: None   Other Topics Concern  . Not on file   Social History Narrative    Review of Systems: See HPI, otherwise negative ROS  Physical Exam: BP 138/96 mmHg  Pulse 56  Temp(Src) 96.8 F (36 C) (Tympanic)  Resp 17  Ht 5\' 6"  (1.676 m)  Wt 79.833 kg (176 lb)  BMI 28.42 kg/m2  SpO2 98% General:   Alert,  pleasant and cooperative in NAD Head:  Normocephalic and atraumatic. Neck:  Supple; no masses or thyromegaly. Lungs:  Clear throughout to auscultation.    Heart:  Regular rate and rhythm. Abdomen:  Soft, nontender and nondistended. Normal bowel sounds, without guarding, and without rebound.   Neurologic:  Alert and  oriented x4;  grossly normal neurologically.  Impression/Plan: [name] is here for an colonoscopy to be performed for [facility] colon polyps  Risks, benefits, limitations, and alternatives regarding  colonoscopy have been reviewed with the patient.  Questions have been answered.  All parties agreeable.   Gaylyn Cheers, MD  01/30/2016, 7:35 AM

## 2016-01-30 NOTE — Anesthesia Preprocedure Evaluation (Addendum)
Anesthesia Evaluation  Patient identified by MRN, date of birth, ID band Patient awake    Reviewed: Allergy & Precautions, H&P , NPO status , Patient's Chart, lab work & pertinent test results  History of Anesthesia Complications Negative for: history of anesthetic complications  Airway Mallampati: III  TM Distance: >3 FB Neck ROM: full    Dental  (+) Poor Dentition, Chipped, Missing, Upper Dentures   Pulmonary neg shortness of breath, former smoker,    Pulmonary exam normal breath sounds clear to auscultation       Cardiovascular Exercise Tolerance: Good (-) angina(-) Past MI and (-) DOE negative cardio ROS Normal cardiovascular exam+ dysrhythmias Atrial Fibrillation  Rhythm:regular Rate:Normal     Neuro/Psych CVA, Residual Symptoms negative psych ROS   GI/Hepatic negative GI ROS, Neg liver ROS,   Endo/Other  diabetes, Type 2Hypothyroidism   Renal/GU Renal disease  negative genitourinary   Musculoskeletal   Abdominal   Peds  Hematology negative hematology ROS (+)   Anesthesia Other Findings Past Medical History:   Stroke ([place])                                                 IDA (iron deficiency anemia)                    04/04/2015     B12 deficiency                                               CVA (cerebral infarction)                                      Comment:left hemiparesis   Diabetes mellitus                                            Type 2 diabetes mellitus with renal manifestat*              Hematuria                                                    Hyperlipidemia                                               Hypothyroidism                                               Osteoarthritis  Vitiligo                                                    Past Surgical History:   CHOLECYSTECTOMY                                               NOSE SURGERY                                                   CAROTID ENDARTERECTOMY                                        CERVICAL SPINE SURGERY                                        LAMINOTOMY / EXCISION DISK POSTERIOR CERVICAL *              BMI    Body Mass Index   28.42 kg/m 2      Reproductive/Obstetrics negative OB ROS                            Anesthesia Physical Anesthesia Plan  ASA: III  Anesthesia Plan: General   Post-op Pain Management:    Induction:   Airway Management Planned:   Additional Equipment:   Intra-op Plan:   Post-operative Plan:   Informed Consent: I have reviewed the patients History and Physical, chart, labs and discussed the procedure including the risks, benefits and alternatives for the proposed anesthesia with the patient or authorized representative who has indicated his/her understanding and acceptance.   Dental Advisory Given  Plan Discussed with: Anesthesiologist, CRNA and Surgeon  Anesthesia Plan Comments:         Anesthesia Quick Evaluation

## 2016-01-30 NOTE — Transfer of Care (Signed)
Immediate Anesthesia Transfer of Care Note  Patient: Eric Lopez  Procedure(s) Performed: Procedure(s): COLONOSCOPY WITH PROPOFOL (N/A)  Patient Location: PACU  Anesthesia Type:General  Level of Consciousness: sedated  Airway & Oxygen Therapy: Patient Spontanous Breathing and Patient connected to nasal cannula oxygen  Post-op Assessment: Report given to RN and Post -op Vital signs reviewed and stable  Post vital signs: Reviewed and stable  Last Vitals:  Filed Vitals:   01/30/16 0641  BP: 138/96  Pulse: 56  Temp: 36 C  Resp: 17    Complications: No apparent anesthesia complications

## 2016-02-02 ENCOUNTER — Encounter: Payer: Self-pay | Admitting: Unknown Physician Specialty

## 2016-02-02 LAB — SURGICAL PATHOLOGY

## 2016-02-05 DIAGNOSIS — I4891 Unspecified atrial fibrillation: Secondary | ICD-10-CM | POA: Diagnosis not present

## 2016-02-05 DIAGNOSIS — Z23 Encounter for immunization: Secondary | ICD-10-CM | POA: Diagnosis not present

## 2016-02-13 ENCOUNTER — Inpatient Hospital Stay: Payer: Commercial Managed Care - HMO | Attending: Internal Medicine | Admitting: *Deleted

## 2016-02-13 DIAGNOSIS — Z79899 Other long term (current) drug therapy: Secondary | ICD-10-CM | POA: Diagnosis not present

## 2016-02-13 DIAGNOSIS — D509 Iron deficiency anemia, unspecified: Secondary | ICD-10-CM | POA: Insufficient documentation

## 2016-02-13 LAB — CBC WITH DIFFERENTIAL/PLATELET
BASOS PCT: 1 %
Basophils Absolute: 0.1 10*3/uL (ref 0–0.1)
EOS ABS: 0.5 10*3/uL (ref 0–0.7)
EOS PCT: 5 %
HCT: 35.6 % — ABNORMAL LOW (ref 40.0–52.0)
Hemoglobin: 12.4 g/dL — ABNORMAL LOW (ref 13.0–18.0)
LYMPHS ABS: 1.6 10*3/uL (ref 1.0–3.6)
Lymphocytes Relative: 18 %
MCH: 30.6 pg (ref 26.0–34.0)
MCHC: 34.7 g/dL (ref 32.0–36.0)
MCV: 88.2 fL (ref 80.0–100.0)
Monocytes Absolute: 0.5 10*3/uL (ref 0.2–1.0)
Monocytes Relative: 6 %
Neutro Abs: 6.6 10*3/uL — ABNORMAL HIGH (ref 1.4–6.5)
Neutrophils Relative %: 70 %
PLATELETS: 196 10*3/uL (ref 150–440)
RBC: 4.03 MIL/uL — AB (ref 4.40–5.90)
RDW: 13.6 % (ref 11.5–14.5)
WBC: 9.2 10*3/uL (ref 3.8–10.6)

## 2016-02-13 LAB — IRON AND TIBC
IRON: 45 ug/dL (ref 45–182)
SATURATION RATIOS: 11 % — AB (ref 17.9–39.5)
TIBC: 423 ug/dL (ref 250–450)
UIBC: 378 ug/dL

## 2016-02-13 LAB — FERRITIN: FERRITIN: 15 ng/mL — AB (ref 24–336)

## 2016-02-17 ENCOUNTER — Other Ambulatory Visit: Payer: Self-pay | Admitting: Internal Medicine

## 2016-02-18 ENCOUNTER — Telehealth: Payer: Self-pay | Admitting: *Deleted

## 2016-02-18 DIAGNOSIS — D509 Iron deficiency anemia, unspecified: Secondary | ICD-10-CM

## 2016-02-18 NOTE — Telephone Encounter (Signed)
-----   Message from Cammie Sickle, MD sent at 02/17/2016  4:04 PM EDT ----- Pt needs IV ferrahem x1 this week/or next; now and then see me back in July/next visit cbc/cmp/iron studies/ferritin- few days before/possible ferrahem. Please inform pt.

## 2016-02-18 NOTE — Telephone Encounter (Signed)
Left vm for pt to call our office to discuss his lab results and tx plan.

## 2016-02-19 DIAGNOSIS — D485 Neoplasm of uncertain behavior of skin: Secondary | ICD-10-CM | POA: Diagnosis not present

## 2016-02-19 DIAGNOSIS — Z08 Encounter for follow-up examination after completed treatment for malignant neoplasm: Secondary | ICD-10-CM | POA: Diagnosis not present

## 2016-02-19 DIAGNOSIS — L578 Other skin changes due to chronic exposure to nonionizing radiation: Secondary | ICD-10-CM | POA: Diagnosis not present

## 2016-02-19 DIAGNOSIS — L570 Actinic keratosis: Secondary | ICD-10-CM | POA: Diagnosis not present

## 2016-02-19 DIAGNOSIS — L80 Vitiligo: Secondary | ICD-10-CM | POA: Diagnosis not present

## 2016-02-19 DIAGNOSIS — Z85828 Personal history of other malignant neoplasm of skin: Secondary | ICD-10-CM | POA: Diagnosis not present

## 2016-02-19 NOTE — Telephone Encounter (Signed)
Spoke with pt's wife Romie Minus. Explained that her husband needs IV iron and iron stores were low. Pt's wife asked RN to change the lab apt on 7/5 to 7/6. Pt unable to make any Wed. Apts.  This apt was changed per pt request.

## 2016-02-25 ENCOUNTER — Ambulatory Visit: Payer: Commercial Managed Care - HMO

## 2016-02-26 ENCOUNTER — Other Ambulatory Visit: Payer: Self-pay | Admitting: Internal Medicine

## 2016-02-26 ENCOUNTER — Inpatient Hospital Stay: Payer: Commercial Managed Care - HMO

## 2016-02-26 VITALS — BP 175/65 | HR 58 | Temp 97.9°F | Resp 18

## 2016-02-26 DIAGNOSIS — K228 Other specified diseases of esophagus: Secondary | ICD-10-CM | POA: Diagnosis not present

## 2016-02-26 DIAGNOSIS — K224 Dyskinesia of esophagus: Secondary | ICD-10-CM | POA: Diagnosis not present

## 2016-02-26 DIAGNOSIS — D509 Iron deficiency anemia, unspecified: Secondary | ICD-10-CM | POA: Diagnosis not present

## 2016-02-26 DIAGNOSIS — Z86010 Personal history of colonic polyps: Secondary | ICD-10-CM | POA: Diagnosis not present

## 2016-02-26 DIAGNOSIS — Z79899 Other long term (current) drug therapy: Secondary | ICD-10-CM | POA: Diagnosis not present

## 2016-02-26 MED ORDER — SODIUM CHLORIDE 0.9 % IV SOLN
510.0000 mg | Freq: Once | INTRAVENOUS | Status: AC
  Administered 2016-02-26: 510 mg via INTRAVENOUS
  Filled 2016-02-26: qty 17

## 2016-02-26 MED ORDER — SODIUM CHLORIDE 0.9 % IV SOLN
Freq: Once | INTRAVENOUS | Status: AC
  Administered 2016-02-26: 11:00:00 via INTRAVENOUS
  Filled 2016-02-26: qty 1000

## 2016-03-11 IMAGING — US US CAROTID DUPLEX BILAT
1 series · 13 of 24 positions shown · non-contrast
Comparison: Ultrasound report [DATE].

CLINICAL DATA: Cerebral infarction.

EXAM:
BILATERAL CAROTID DUPLEX ULTRASOUND
TECHNIQUE: Gray scale imaging, color Doppler and duplex ultrasound were
performed of bilateral carotid and vertebral arteries in the neck.

[Series 1: us carotid duplex bilat · 0.06mm/px · 13 of 72 slices shown]
[im 1/72]
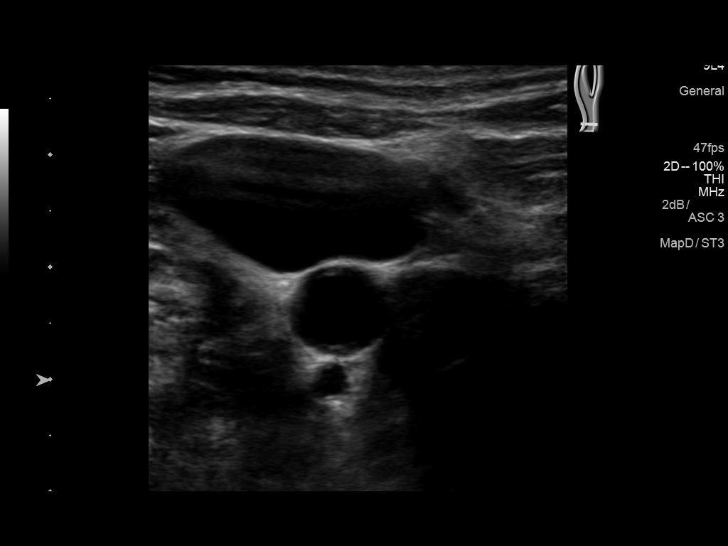
[im 7/72]
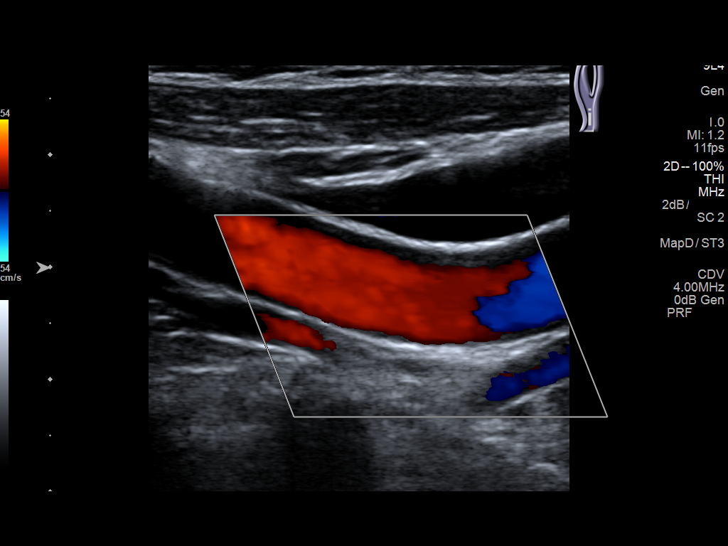
[im 13/72]
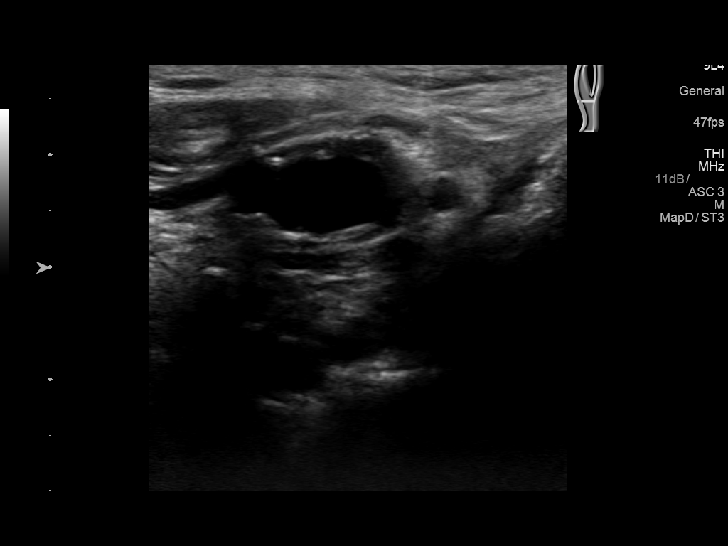
[im 19/72]
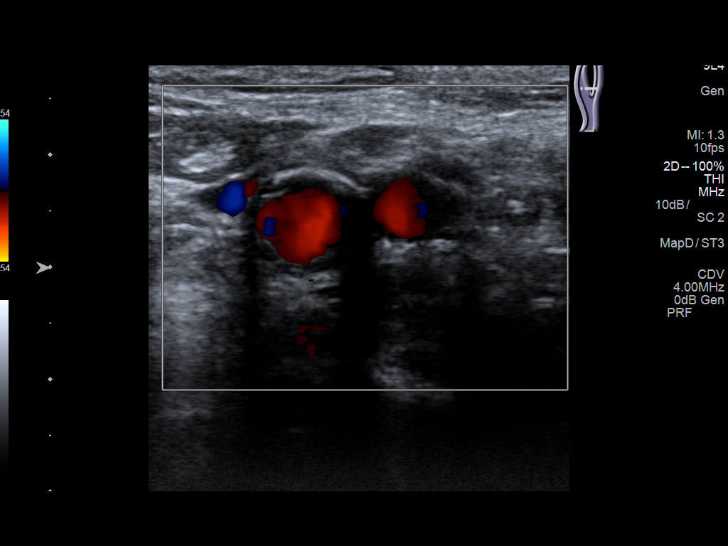
[im 25/72]
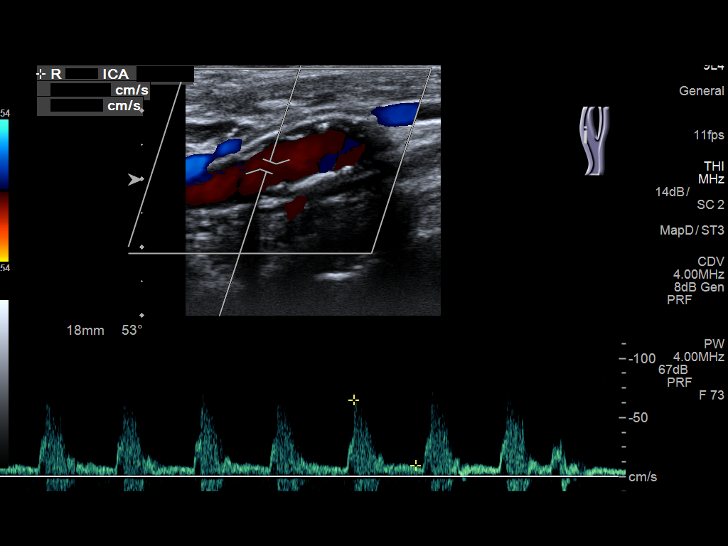
[im 31/72]
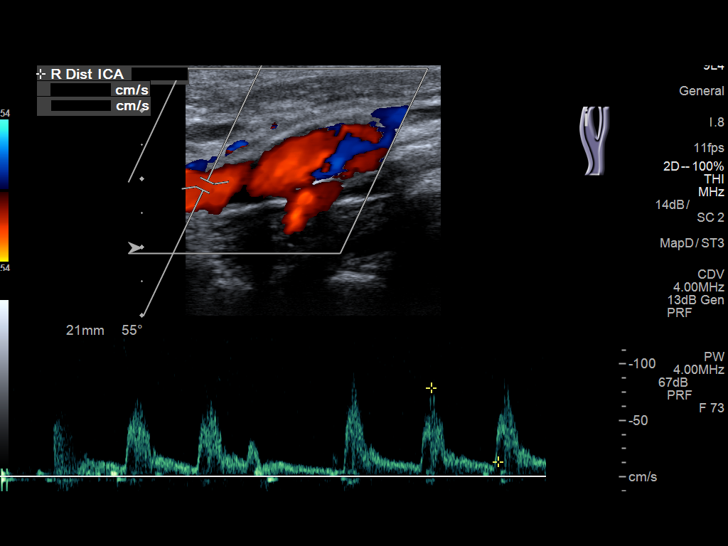
[im 38/72]
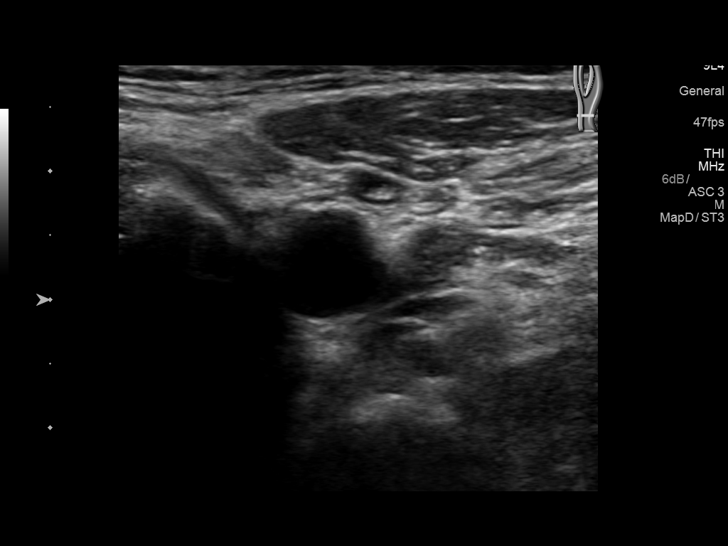
[im 41/72]
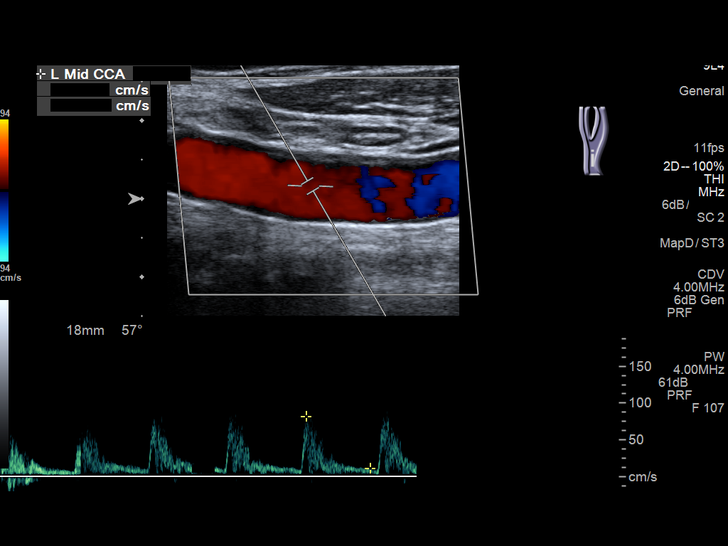
[im 47/72]
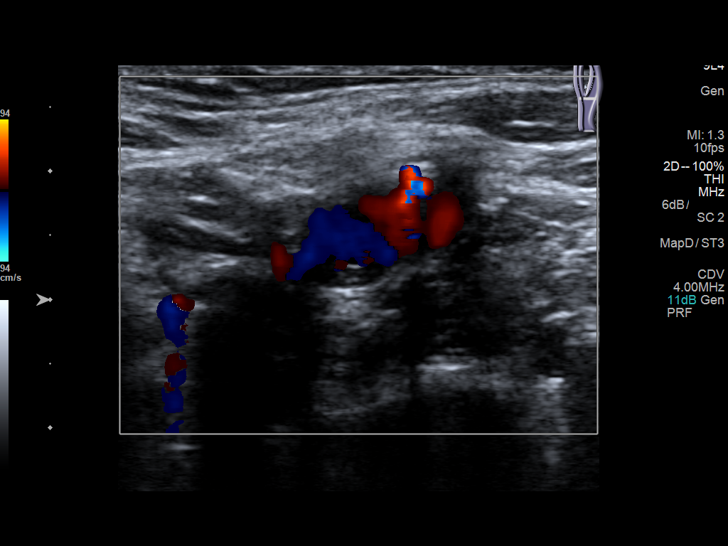
[im 53/72]
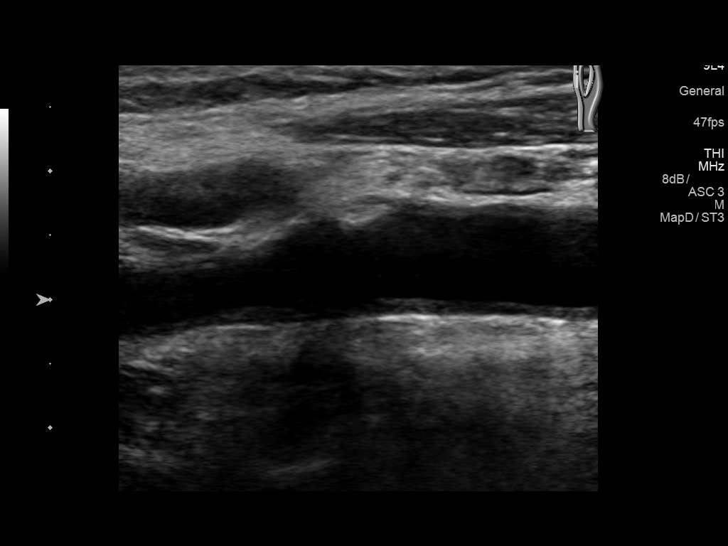
[im 59/72]
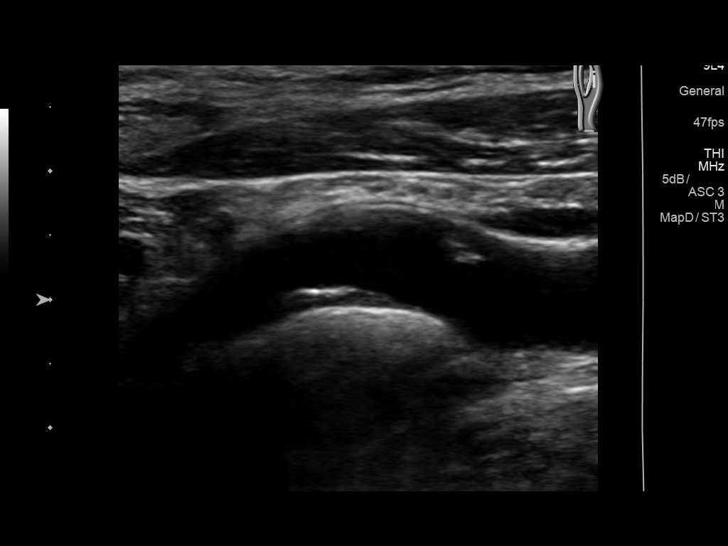
[im 65/72]
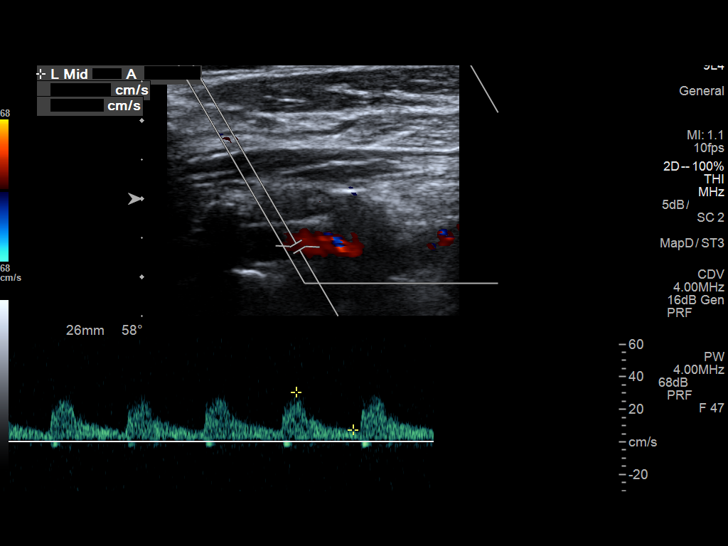
[im 72/72]
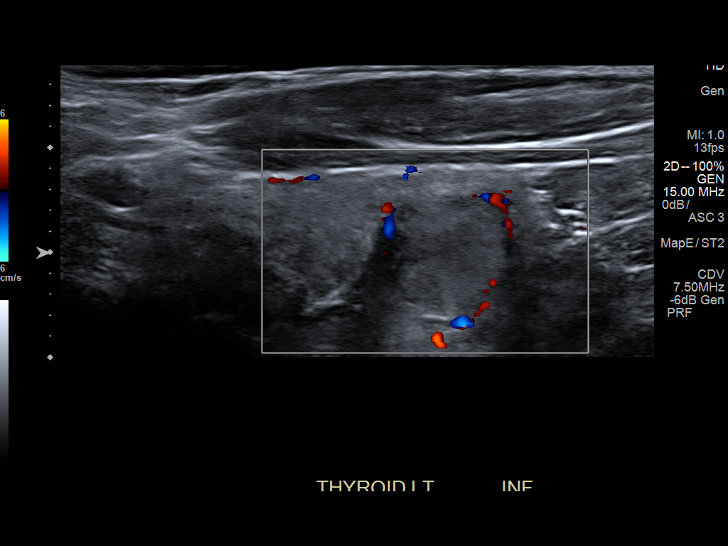

[13 of 24 positions shown; findings below may reference images not displayed]

FINDINGS: Criteria: Quantification of carotid stenosis is based on velocity
parameters that correlate the residual internal carotid diameter
with NASCET-based stenosis levels, using the diameter of the distal
internal carotid lumen as the denominator for stenosis measurement.

The following velocity measurements were obtained:

RIGHT

ICA:  79/13 cm/sec

CCA:  110/13 cm/sec

SYSTOLIC ICA/CCA RATIO:

DIASTOLIC ICA/CCA RATIO:

ECA:  92 cm/sec

LEFT

ICA:  102/24 cm/sec

CCA:  92/12 cm/sec

SYSTOLIC ICA/CCA RATIO:

DIASTOLIC ICA/CCA RATIO:

ECA:  151 cm/sec

RIGHT CAROTID ARTERY: Mild right carotid bifurcation atherosclerotic
vascular disease. No flow limiting stenosis.

RIGHT VERTEBRAL ARTERY:  Patent with antegrade flow.

LEFT CAROTID ARTERY: Mild left carotid bifurcation atherosclerotic
vascular disease. No flow limiting stenosis .

LEFT VERTEBRAL ARTERY:  Patent with antegrade flow.

Incidental note is made of a left lobe thyroid nodule measuring
cm in maximum diameter. Dedicated thyroid ultrasound suggested .
IMPRESSION: 1. Mild bilateral carotid bifurcation atherosclerotic vascular
disease. No flow limiting stenosis.

2. Vertebral arteries are patent antegrade flow.

3. Incidental note is made of a 1.3 cm left lobe thyroid nodule.
Thyroid ultrasound suggested for further evaluation.

## 2016-05-13 DIAGNOSIS — Z0000 Encounter for general adult medical examination without abnormal findings: Secondary | ICD-10-CM | POA: Diagnosis not present

## 2016-05-13 DIAGNOSIS — E782 Mixed hyperlipidemia: Secondary | ICD-10-CM | POA: Diagnosis not present

## 2016-05-13 DIAGNOSIS — E119 Type 2 diabetes mellitus without complications: Secondary | ICD-10-CM | POA: Diagnosis not present

## 2016-05-21 DIAGNOSIS — E782 Mixed hyperlipidemia: Secondary | ICD-10-CM | POA: Diagnosis not present

## 2016-05-21 DIAGNOSIS — E538 Deficiency of other specified B group vitamins: Secondary | ICD-10-CM | POA: Diagnosis not present

## 2016-05-21 DIAGNOSIS — E1129 Type 2 diabetes mellitus with other diabetic kidney complication: Secondary | ICD-10-CM | POA: Diagnosis not present

## 2016-05-21 DIAGNOSIS — R809 Proteinuria, unspecified: Secondary | ICD-10-CM | POA: Diagnosis not present

## 2016-06-02 ENCOUNTER — Other Ambulatory Visit: Payer: Commercial Managed Care - HMO

## 2016-06-03 ENCOUNTER — Inpatient Hospital Stay: Payer: Commercial Managed Care - HMO | Attending: Internal Medicine

## 2016-06-03 ENCOUNTER — Other Ambulatory Visit: Payer: Self-pay | Admitting: Internal Medicine

## 2016-06-03 DIAGNOSIS — Z7982 Long term (current) use of aspirin: Secondary | ICD-10-CM | POA: Diagnosis not present

## 2016-06-03 DIAGNOSIS — D509 Iron deficiency anemia, unspecified: Secondary | ICD-10-CM | POA: Diagnosis not present

## 2016-06-03 DIAGNOSIS — E538 Deficiency of other specified B group vitamins: Secondary | ICD-10-CM | POA: Diagnosis not present

## 2016-06-03 DIAGNOSIS — M1990 Unspecified osteoarthritis, unspecified site: Secondary | ICD-10-CM | POA: Diagnosis not present

## 2016-06-03 DIAGNOSIS — Z87891 Personal history of nicotine dependence: Secondary | ICD-10-CM | POA: Insufficient documentation

## 2016-06-03 DIAGNOSIS — Z79899 Other long term (current) drug therapy: Secondary | ICD-10-CM | POA: Insufficient documentation

## 2016-06-03 DIAGNOSIS — E119 Type 2 diabetes mellitus without complications: Secondary | ICD-10-CM | POA: Insufficient documentation

## 2016-06-03 DIAGNOSIS — E039 Hypothyroidism, unspecified: Secondary | ICD-10-CM | POA: Diagnosis not present

## 2016-06-03 DIAGNOSIS — Z8673 Personal history of transient ischemic attack (TIA), and cerebral infarction without residual deficits: Secondary | ICD-10-CM | POA: Diagnosis not present

## 2016-06-03 DIAGNOSIS — E785 Hyperlipidemia, unspecified: Secondary | ICD-10-CM | POA: Diagnosis not present

## 2016-06-03 LAB — COMPREHENSIVE METABOLIC PANEL
ALT: 34 U/L (ref 17–63)
ANION GAP: 6 (ref 5–15)
AST: 35 U/L (ref 15–41)
Albumin: 4.2 g/dL (ref 3.5–5.0)
Alkaline Phosphatase: 50 U/L (ref 38–126)
BILIRUBIN TOTAL: 0.5 mg/dL (ref 0.3–1.2)
BUN: 16 mg/dL (ref 6–20)
CALCIUM: 9.1 mg/dL (ref 8.9–10.3)
CO2: 26 mmol/L (ref 22–32)
Chloride: 108 mmol/L (ref 101–111)
Creatinine, Ser: 1.1 mg/dL (ref 0.61–1.24)
GFR calc Af Amer: >60 mL/min (ref >60)
Glucose, Bld: 163 mg/dL — ABNORMAL HIGH (ref 65–99)
POTASSIUM: 4.7 mmol/L (ref 3.5–5.1)
Sodium: 140 mmol/L (ref 135–145)
TOTAL PROTEIN: 7.1 g/dL (ref 6.5–8.1)

## 2016-06-03 LAB — CBC WITH DIFFERENTIAL/PLATELET
BASOS PCT: 0 %
Basophils Absolute: 0 10*3/uL (ref 0–0.1)
Eosinophils Absolute: 0.3 10*3/uL (ref 0–0.7)
Eosinophils Relative: 4 %
HEMATOCRIT: 40.2 % (ref 40.0–52.0)
Hemoglobin: 13.7 g/dL (ref 13.0–18.0)
LYMPHS ABS: 1.4 10*3/uL (ref 1.0–3.6)
LYMPHS PCT: 17 %
MCH: 30.4 pg (ref 26.0–34.0)
MCHC: 34.1 g/dL (ref 32.0–36.0)
MCV: 88.9 fL (ref 80.0–100.0)
MONO ABS: 0.5 10*3/uL (ref 0.2–1.0)
Monocytes Relative: 7 %
NEUTROS ABS: 5.7 10*3/uL (ref 1.4–6.5)
Neutrophils Relative %: 72 %
Platelets: 177 10*3/uL (ref 150–440)
RBC: 4.52 MIL/uL (ref 4.40–5.90)
RDW: 14.4 % (ref 11.5–14.5)
WBC: 7.9 10*3/uL (ref 3.8–10.6)

## 2016-06-03 LAB — IRON AND TIBC
Iron: 86 ug/dL (ref 45–182)
SATURATION RATIOS: 22 % (ref 17.9–39.5)
TIBC: 386 ug/dL (ref 250–450)
UIBC: 300 ug/dL

## 2016-06-03 LAB — FERRITIN: FERRITIN: 37 ng/mL (ref 24–336)

## 2016-06-04 ENCOUNTER — Inpatient Hospital Stay (HOSPITAL_BASED_OUTPATIENT_CLINIC_OR_DEPARTMENT_OTHER): Payer: Commercial Managed Care - HMO | Admitting: Family Medicine

## 2016-06-04 ENCOUNTER — Inpatient Hospital Stay: Payer: Commercial Managed Care - HMO

## 2016-06-04 ENCOUNTER — Other Ambulatory Visit: Payer: Commercial Managed Care - HMO

## 2016-06-04 ENCOUNTER — Ambulatory Visit: Payer: Commercial Managed Care - HMO | Admitting: Internal Medicine

## 2016-06-04 VITALS — BP 148/67 | HR 47 | Temp 96.3°F | Resp 18 | Wt 175.9 lb

## 2016-06-04 DIAGNOSIS — E039 Hypothyroidism, unspecified: Secondary | ICD-10-CM | POA: Diagnosis not present

## 2016-06-04 DIAGNOSIS — Z8673 Personal history of transient ischemic attack (TIA), and cerebral infarction without residual deficits: Secondary | ICD-10-CM | POA: Diagnosis not present

## 2016-06-04 DIAGNOSIS — Z87891 Personal history of nicotine dependence: Secondary | ICD-10-CM

## 2016-06-04 DIAGNOSIS — M1990 Unspecified osteoarthritis, unspecified site: Secondary | ICD-10-CM

## 2016-06-04 DIAGNOSIS — Z7982 Long term (current) use of aspirin: Secondary | ICD-10-CM

## 2016-06-04 DIAGNOSIS — E119 Type 2 diabetes mellitus without complications: Secondary | ICD-10-CM

## 2016-06-04 DIAGNOSIS — Z79899 Other long term (current) drug therapy: Secondary | ICD-10-CM

## 2016-06-04 DIAGNOSIS — E785 Hyperlipidemia, unspecified: Secondary | ICD-10-CM

## 2016-06-04 DIAGNOSIS — E538 Deficiency of other specified B group vitamins: Secondary | ICD-10-CM

## 2016-06-04 DIAGNOSIS — D509 Iron deficiency anemia, unspecified: Secondary | ICD-10-CM | POA: Diagnosis not present

## 2016-06-04 NOTE — Progress Notes (Signed)
[place]  [phone] [phone]     ID: Eric Lopez OB: 19-Jan-1941  MR#: [id number]  [id number]  Patient Care Team: Rusty Aus, MD as PCP - General (Internal Medicine)  CHIEF COMPLAINT/DIAGNOSIS:  Persistent Iron deficiency Anemia unresponsive to oral iron therapy (on oral ferrous sulfate twice daily for >2 months). (Labs done on 03/14/15 showed hemoglobin 10.6, ferritin low at 8. In Feb 2016, Hb 10.4 with ferritin of 7. Hemoglobin was 11.1 in June 2015. Recent stool Hemoccult reportedly negative).   HISTORY OF PRESENT ILLNESS:  Patient returns for continued hematology follow-up, he was seen a few months ago. States that he is doing fairly steady. He denies any new fatigue, dyspnea, orthopnea, chest pain or PND. Denies any obvious bleeding symptoms. Appetite is steady. No new bone pains. Patient most recently received Feraheme 510 mg on 02/26/2016.  REVIEW OF SYSTEMS:   Review of Systems  Constitutional: Negative for fever, chills, weight loss, malaise/fatigue and diaphoresis.  HENT: Negative.   Eyes: Negative.   Respiratory: Negative for cough, hemoptysis, sputum production, shortness of breath and wheezing.   Cardiovascular: Negative for chest pain, palpitations, orthopnea, claudication, leg swelling and PND.  Gastrointestinal: Negative for heartburn, nausea, vomiting, abdominal pain, diarrhea, constipation, blood in stool and melena.  Genitourinary: Negative.   Musculoskeletal: Negative.   Skin: Negative.   Neurological: Negative for dizziness, tingling, focal weakness, seizures and weakness.  Endo/Heme/Allergies: Does not bruise/bleed easily.  Psychiatric/Behavioral: Negative for depression. The patient is not nervous/anxious and does not have insomnia.    PAST MEDICAL HISTORY: Reviewed. Past Medical History  Diagnosis Date  . Stroke ([place])   . IDA (iron deficiency anemia) 04/04/2015  . B12 deficiency   . CVA (cerebral  infarction)     left hemiparesis  . Diabetes mellitus   . Type 2 diabetes mellitus with renal manifestations, controlled ([place])   . Hematuria   . Hyperlipidemia   . Hypothyroidism   . Osteoarthritis   . Vitiligo           CVA, left hemiparesis June 2015  Diabetes mellitus  Hematuria  B12 deficiency  Hyperlipidemia  Hypothyroidism  Vitiligo  Osteoarthritis  Cholecystectomy  Right Carotid endarterectomy  Nasal septoplasty  Laminotomy/excision disc posterior cervical spine  Colonoscopy 2012 reported internal hemorrhoids, otherwise unremarkable  PAST SURGICAL HISTORY: Reviewed. Past Surgical History  Procedure Laterality Date  . Cholecystectomy    . Nose surgery    . Carotid endarterectomy    . Cervical spine surgery    . Laminotomy / excision disk posterior cervical spine    . Colonoscopy with propofol N/A 01/30/2016    Procedure: COLONOSCOPY WITH PROPOFOL;  Surgeon: Manya Silvas, MD;  Location: [facility] ENDOSCOPY;  Service: Endoscopy;  Laterality: N/A;    FAMILY HISTORY: Reviewed. No family history on file.  SOCIAL HISTORY: Reviewed. Social History  Substance Use Topics  . Smoking status: Former Smoker -- 1.00 packs/day for 35 years    Types: Pipe, Cigars    Quit date: 08/30/1999  . Smokeless tobacco: Never Used  . Alcohol Use: No    Allergies  Allergen Reactions  . Sulfa Antibiotics Itching and Rash    Current Outpatient Prescriptions  Medication Sig Dispense Refill  . aspirin EC 81 MG tablet Take by mouth.    . baclofen (LIORESAL) 20 MG tablet Take by mouth.    . citalopram (CELEXA) 20 MG tablet Take by mouth.    . Cyanocobalamin (RA VITAMIN B-12 TR) 1000 MCG  TBCR Take by mouth.    . gabapentin (NEURONTIN) 300 MG capsule Take by mouth.    Marland Kitchen glimepiride (AMARYL) 2 MG tablet Take by mouth.    . levothyroxine (SYNTHROID, LEVOTHROID) 100 MCG tablet Take by mouth.    Marland Kitchen lisinopril (PRINIVIL,ZESTRIL) 10 MG tablet Take by mouth.    . Melatonin (MELATONIN MAXIMUM  STRENGTH) 5 MG TABS Take by mouth.    . metFORMIN (GLUCOPHAGE) 500 MG tablet Take by mouth.    Marland Kitchen omeprazole (PRILOSEC) 40 MG capsule TAKE 1 CAPSULE (40 MG TOTAL) BY MOUTH DAILY.    . simvastatin (ZOCOR) 40 MG tablet Take by mouth.     No current facility-administered medications for this visit.    PHYSICAL EXAM: Filed Vitals:   06/04/16 1351  BP: 148/67  Pulse: 47  Temp: 96.3 F (35.7 C)  Resp: 18     Body mass index is 28.41 kg/(m^2).    GENERAL: Patient is alert and oriented and in no acute distress. There is no icterus or pallor. HEENT: EOMs intact. Oral exam negative for thrush or lesions. No cervical lymphadenopathy. CVS: S1S2, regular LUNGS: Bilaterally clear to auscultation, no rhonchi. ABDOMEN: Soft, nontender. No hepatosplenomegaly clinically.  EXTREMITIES: No pedal edema.   LAB RESULTS: Hemoglobin 13.6, MCV 86.5, WBC 7.6, platelets 163, serum iron 101, TIBC 387, ferritin 37, iron saturation 26%. Lab Results  Component Value Date   WBC 7.9 06/03/2016   NEUTROABS 5.7 06/03/2016   HGB 13.7 06/03/2016   HCT 40.2 06/03/2016   MCV 88.9 06/03/2016   PLT 177 06/03/2016    Lab Results  Component Value Date   IRON 86 06/03/2016     ASSESSMENT / PLAN:   Iron deficiency Anemia unresponsive to oral iron therapy (on oral ferrous sulfate twice daily for >2 months).  Hemoglobin has normalized, iron study also shows normal serum iron, ferritin within normal limits. Patient attempted use of ferrous sulfate 325 mg daily but developed side effects including constipation, GI upset, nausea. He has required of Feraheme 510 mg infusion in March 2017.  Will monitor CBC, ferritin, and TIBC in 3 months. Patient will have his next provider follow-up in approximately 6 months, we'll hold chair for possible Feraheme.    In between visits, the patient has been advised to call or come to ER in case of acute sickness or new symptoms. He is agreeable to this plan.   Dr. Rogue Bussing was  available for consultation and review of plan of care for this patient.  Evlyn Kanner, NP   06/04/2016 2:03 PM

## 2016-06-17 ENCOUNTER — Other Ambulatory Visit: Payer: Self-pay | Admitting: Internal Medicine

## 2016-06-17 DIAGNOSIS — I63323 Cerebral infarction due to thrombosis of bilateral anterior cerebral arteries: Secondary | ICD-10-CM

## 2016-06-25 ENCOUNTER — Ambulatory Visit
Admission: RE | Admit: 2016-06-25 | Discharge: 2016-06-25 | Disposition: A | Discharge status: Home / Self Care | Payer: Commercial Managed Care - HMO | Source: Ambulatory Visit | Attending: Internal Medicine | Admitting: Internal Medicine

## 2016-06-25 DIAGNOSIS — I63323 Cerebral infarction due to thrombosis of bilateral anterior cerebral arteries: Secondary | ICD-10-CM | POA: Diagnosis not present

## 2016-06-25 DIAGNOSIS — I6523 Occlusion and stenosis of bilateral carotid arteries: Secondary | ICD-10-CM | POA: Insufficient documentation

## 2016-06-25 DIAGNOSIS — I639 Cerebral infarction, unspecified: Secondary | ICD-10-CM | POA: Diagnosis not present

## 2016-07-02 DIAGNOSIS — E041 Nontoxic single thyroid nodule: Secondary | ICD-10-CM | POA: Diagnosis not present

## 2016-08-27 DIAGNOSIS — E041 Nontoxic single thyroid nodule: Secondary | ICD-10-CM | POA: Diagnosis not present

## 2016-08-27 DIAGNOSIS — E039 Hypothyroidism, unspecified: Secondary | ICD-10-CM | POA: Diagnosis not present

## 2016-09-06 ENCOUNTER — Other Ambulatory Visit: Payer: Commercial Managed Care - HMO

## 2016-09-07 ENCOUNTER — Ambulatory Visit: Payer: Commercial Managed Care - HMO

## 2016-09-09 ENCOUNTER — Inpatient Hospital Stay: Payer: Commercial Managed Care - HMO | Attending: Internal Medicine

## 2016-09-09 DIAGNOSIS — D509 Iron deficiency anemia, unspecified: Secondary | ICD-10-CM

## 2016-09-09 LAB — CBC WITH DIFFERENTIAL/PLATELET
BASOS PCT: 0 %
Basophils Absolute: 0 10*3/uL (ref 0–0.1)
EOS ABS: 0.3 10*3/uL (ref 0–0.7)
Eosinophils Relative: 3 %
HCT: 40.3 % (ref 40.0–52.0)
HEMOGLOBIN: 13.8 g/dL (ref 13.0–18.0)
Lymphocytes Relative: 19 %
Lymphs Abs: 2.1 10*3/uL (ref 1.0–3.6)
MCH: 31 pg (ref 26.0–34.0)
MCHC: 34.4 g/dL (ref 32.0–36.0)
MCV: 90.1 fL (ref 80.0–100.0)
Monocytes Absolute: 0.7 10*3/uL (ref 0.2–1.0)
Monocytes Relative: 6 %
NEUTROS PCT: 72 %
Neutro Abs: 8.1 10*3/uL — ABNORMAL HIGH (ref 1.4–6.5)
Platelets: 197 10*3/uL (ref 150–440)
RBC: 4.47 MIL/uL (ref 4.40–5.90)
RDW: 13.3 % (ref 11.5–14.5)
WBC: 11.2 10*3/uL — AB (ref 3.8–10.6)

## 2016-09-09 LAB — IRON AND TIBC
IRON: 73 ug/dL (ref 45–182)
Saturation Ratios: 19 % (ref 17.9–39.5)
TIBC: 391 ug/dL (ref 250–450)
UIBC: 318 ug/dL

## 2016-09-09 LAB — FERRITIN: FERRITIN: 32 ng/mL (ref 24–336)

## 2016-09-10 ENCOUNTER — Inpatient Hospital Stay: Payer: Commercial Managed Care - HMO

## 2016-09-24 DIAGNOSIS — E041 Nontoxic single thyroid nodule: Secondary | ICD-10-CM | POA: Diagnosis not present

## 2016-09-24 DIAGNOSIS — Z23 Encounter for immunization: Secondary | ICD-10-CM | POA: Diagnosis not present

## 2016-09-25 DIAGNOSIS — E041 Nontoxic single thyroid nodule: Secondary | ICD-10-CM | POA: Diagnosis not present

## 2016-10-07 DIAGNOSIS — R109 Unspecified abdominal pain: Secondary | ICD-10-CM | POA: Diagnosis not present

## 2016-10-07 DIAGNOSIS — K2900 Acute gastritis without bleeding: Secondary | ICD-10-CM | POA: Diagnosis not present

## 2016-10-20 DIAGNOSIS — E041 Nontoxic single thyroid nodule: Secondary | ICD-10-CM | POA: Diagnosis not present

## 2016-10-27 DIAGNOSIS — G629 Polyneuropathy, unspecified: Secondary | ICD-10-CM | POA: Diagnosis not present

## 2016-10-27 DIAGNOSIS — E785 Hyperlipidemia, unspecified: Secondary | ICD-10-CM | POA: Diagnosis not present

## 2016-10-27 DIAGNOSIS — I10 Essential (primary) hypertension: Secondary | ICD-10-CM | POA: Diagnosis not present

## 2016-10-27 DIAGNOSIS — E039 Hypothyroidism, unspecified: Secondary | ICD-10-CM | POA: Diagnosis not present

## 2016-10-27 DIAGNOSIS — Z7982 Long term (current) use of aspirin: Secondary | ICD-10-CM | POA: Diagnosis not present

## 2016-10-27 DIAGNOSIS — E041 Nontoxic single thyroid nodule: Secondary | ICD-10-CM | POA: Diagnosis not present

## 2016-10-27 DIAGNOSIS — E119 Type 2 diabetes mellitus without complications: Secondary | ICD-10-CM | POA: Diagnosis not present

## 2016-10-27 DIAGNOSIS — Z8673 Personal history of transient ischemic attack (TIA), and cerebral infarction without residual deficits: Secondary | ICD-10-CM | POA: Diagnosis not present

## 2016-10-27 DIAGNOSIS — Z882 Allergy status to sulfonamides status: Secondary | ICD-10-CM | POA: Diagnosis not present

## 2016-10-27 DIAGNOSIS — Z01818 Encounter for other preprocedural examination: Secondary | ICD-10-CM | POA: Diagnosis not present

## 2016-10-27 DIAGNOSIS — F329 Major depressive disorder, single episode, unspecified: Secondary | ICD-10-CM | POA: Diagnosis not present

## 2016-10-27 DIAGNOSIS — Z79899 Other long term (current) drug therapy: Secondary | ICD-10-CM | POA: Diagnosis not present

## 2016-10-27 DIAGNOSIS — Z7984 Long term (current) use of oral hypoglycemic drugs: Secondary | ICD-10-CM | POA: Diagnosis not present

## 2016-10-27 DIAGNOSIS — Z87891 Personal history of nicotine dependence: Secondary | ICD-10-CM | POA: Diagnosis not present

## 2016-10-29 DIAGNOSIS — H2513 Age-related nuclear cataract, bilateral: Secondary | ICD-10-CM | POA: Diagnosis not present

## 2016-10-29 DIAGNOSIS — H35013 Changes in retinal vascular appearance, bilateral: Secondary | ICD-10-CM | POA: Diagnosis not present

## 2016-10-29 DIAGNOSIS — E119 Type 2 diabetes mellitus without complications: Secondary | ICD-10-CM | POA: Diagnosis not present

## 2016-10-29 DIAGNOSIS — H524 Presbyopia: Secondary | ICD-10-CM | POA: Diagnosis not present

## 2016-11-18 DIAGNOSIS — E538 Deficiency of other specified B group vitamins: Secondary | ICD-10-CM | POA: Diagnosis not present

## 2016-11-18 DIAGNOSIS — Z125 Encounter for screening for malignant neoplasm of prostate: Secondary | ICD-10-CM | POA: Diagnosis not present

## 2016-11-18 DIAGNOSIS — E1129 Type 2 diabetes mellitus with other diabetic kidney complication: Secondary | ICD-10-CM | POA: Diagnosis not present

## 2016-11-18 DIAGNOSIS — R809 Proteinuria, unspecified: Secondary | ICD-10-CM | POA: Diagnosis not present

## 2016-11-18 DIAGNOSIS — E782 Mixed hyperlipidemia: Secondary | ICD-10-CM | POA: Diagnosis not present

## 2016-11-19 DIAGNOSIS — R0602 Shortness of breath: Secondary | ICD-10-CM | POA: Diagnosis not present

## 2016-11-19 DIAGNOSIS — R0789 Other chest pain: Secondary | ICD-10-CM | POA: Diagnosis not present

## 2016-11-19 DIAGNOSIS — E1129 Type 2 diabetes mellitus with other diabetic kidney complication: Secondary | ICD-10-CM | POA: Diagnosis not present

## 2016-11-19 DIAGNOSIS — E782 Mixed hyperlipidemia: Secondary | ICD-10-CM | POA: Diagnosis not present

## 2016-11-19 DIAGNOSIS — R809 Proteinuria, unspecified: Secondary | ICD-10-CM | POA: Diagnosis not present

## 2016-11-26 DIAGNOSIS — E538 Deficiency of other specified B group vitamins: Secondary | ICD-10-CM | POA: Diagnosis not present

## 2016-11-26 DIAGNOSIS — E1121 Type 2 diabetes mellitus with diabetic nephropathy: Secondary | ICD-10-CM | POA: Diagnosis not present

## 2016-11-26 DIAGNOSIS — Z0000 Encounter for general adult medical examination without abnormal findings: Secondary | ICD-10-CM | POA: Diagnosis not present

## 2016-12-10 ENCOUNTER — Ambulatory Visit: Payer: Commercial Managed Care - HMO | Admitting: Internal Medicine

## 2016-12-10 ENCOUNTER — Other Ambulatory Visit: Payer: Commercial Managed Care - HMO

## 2016-12-10 ENCOUNTER — Ambulatory Visit: Payer: Commercial Managed Care - HMO

## 2016-12-10 DIAGNOSIS — R0602 Shortness of breath: Secondary | ICD-10-CM | POA: Diagnosis not present

## 2016-12-10 DIAGNOSIS — R0789 Other chest pain: Secondary | ICD-10-CM | POA: Diagnosis not present

## 2017-01-05 DIAGNOSIS — Z7982 Long term (current) use of aspirin: Secondary | ICD-10-CM | POA: Diagnosis not present

## 2017-01-05 DIAGNOSIS — E119 Type 2 diabetes mellitus without complications: Secondary | ICD-10-CM | POA: Diagnosis not present

## 2017-01-05 DIAGNOSIS — E063 Autoimmune thyroiditis: Secondary | ICD-10-CM | POA: Diagnosis not present

## 2017-01-05 DIAGNOSIS — I10 Essential (primary) hypertension: Secondary | ICD-10-CM | POA: Diagnosis not present

## 2017-01-05 DIAGNOSIS — Z7984 Long term (current) use of oral hypoglycemic drugs: Secondary | ICD-10-CM | POA: Diagnosis not present

## 2017-01-05 DIAGNOSIS — Z79899 Other long term (current) drug therapy: Secondary | ICD-10-CM | POA: Diagnosis not present

## 2017-01-05 DIAGNOSIS — E0500 Thyrotoxicosis with diffuse goiter without thyrotoxic crisis or storm: Secondary | ICD-10-CM | POA: Diagnosis not present

## 2017-01-05 DIAGNOSIS — Z8673 Personal history of transient ischemic attack (TIA), and cerebral infarction without residual deficits: Secondary | ICD-10-CM | POA: Diagnosis not present

## 2017-01-05 DIAGNOSIS — Z87891 Personal history of nicotine dependence: Secondary | ICD-10-CM | POA: Diagnosis not present

## 2017-01-05 DIAGNOSIS — E041 Nontoxic single thyroid nodule: Secondary | ICD-10-CM | POA: Diagnosis not present

## 2017-01-05 DIAGNOSIS — E049 Nontoxic goiter, unspecified: Secondary | ICD-10-CM | POA: Diagnosis not present

## 2017-01-06 DIAGNOSIS — E041 Nontoxic single thyroid nodule: Secondary | ICD-10-CM | POA: Diagnosis not present

## 2017-01-06 DIAGNOSIS — E119 Type 2 diabetes mellitus without complications: Secondary | ICD-10-CM | POA: Diagnosis not present

## 2017-01-06 DIAGNOSIS — E063 Autoimmune thyroiditis: Secondary | ICD-10-CM | POA: Diagnosis not present

## 2017-01-06 DIAGNOSIS — Z7984 Long term (current) use of oral hypoglycemic drugs: Secondary | ICD-10-CM | POA: Diagnosis not present

## 2017-01-06 DIAGNOSIS — Z79899 Other long term (current) drug therapy: Secondary | ICD-10-CM | POA: Diagnosis not present

## 2017-01-06 DIAGNOSIS — Z87891 Personal history of nicotine dependence: Secondary | ICD-10-CM | POA: Diagnosis not present

## 2017-01-06 DIAGNOSIS — Z8673 Personal history of transient ischemic attack (TIA), and cerebral infarction without residual deficits: Secondary | ICD-10-CM | POA: Diagnosis not present

## 2017-01-06 DIAGNOSIS — Z7982 Long term (current) use of aspirin: Secondary | ICD-10-CM | POA: Diagnosis not present

## 2017-01-06 DIAGNOSIS — I10 Essential (primary) hypertension: Secondary | ICD-10-CM | POA: Diagnosis not present

## 2017-01-19 DIAGNOSIS — E890 Postprocedural hypothyroidism: Secondary | ICD-10-CM | POA: Diagnosis not present

## 2017-01-19 DIAGNOSIS — E041 Nontoxic single thyroid nodule: Secondary | ICD-10-CM | POA: Diagnosis not present

## 2017-01-19 DIAGNOSIS — E063 Autoimmune thyroiditis: Secondary | ICD-10-CM | POA: Diagnosis not present

## 2017-02-17 DIAGNOSIS — Z08 Encounter for follow-up examination after completed treatment for malignant neoplasm: Secondary | ICD-10-CM | POA: Diagnosis not present

## 2017-02-17 DIAGNOSIS — Z85828 Personal history of other malignant neoplasm of skin: Secondary | ICD-10-CM | POA: Diagnosis not present

## 2017-02-17 DIAGNOSIS — Z872 Personal history of diseases of the skin and subcutaneous tissue: Secondary | ICD-10-CM | POA: Diagnosis not present

## 2017-02-17 DIAGNOSIS — D485 Neoplasm of uncertain behavior of skin: Secondary | ICD-10-CM | POA: Diagnosis not present

## 2017-02-17 DIAGNOSIS — C44519 Basal cell carcinoma of skin of other part of trunk: Secondary | ICD-10-CM | POA: Diagnosis not present

## 2017-04-15 DIAGNOSIS — E890 Postprocedural hypothyroidism: Secondary | ICD-10-CM | POA: Diagnosis not present

## 2017-05-05 DIAGNOSIS — C44212 Basal cell carcinoma of skin of right ear and external auricular canal: Secondary | ICD-10-CM | POA: Diagnosis not present

## 2017-05-26 DIAGNOSIS — E538 Deficiency of other specified B group vitamins: Secondary | ICD-10-CM | POA: Diagnosis not present

## 2017-05-26 DIAGNOSIS — E890 Postprocedural hypothyroidism: Secondary | ICD-10-CM | POA: Diagnosis not present

## 2017-05-26 DIAGNOSIS — Z0000 Encounter for general adult medical examination without abnormal findings: Secondary | ICD-10-CM | POA: Diagnosis not present

## 2017-05-26 DIAGNOSIS — E1121 Type 2 diabetes mellitus with diabetic nephropathy: Secondary | ICD-10-CM | POA: Diagnosis not present

## 2017-06-02 DIAGNOSIS — Z0000 Encounter for general adult medical examination without abnormal findings: Secondary | ICD-10-CM | POA: Diagnosis not present

## 2017-06-02 DIAGNOSIS — Z79899 Other long term (current) drug therapy: Secondary | ICD-10-CM | POA: Diagnosis not present

## 2017-06-02 DIAGNOSIS — E039 Hypothyroidism, unspecified: Secondary | ICD-10-CM | POA: Diagnosis not present

## 2017-06-02 DIAGNOSIS — Z125 Encounter for screening for malignant neoplasm of prostate: Secondary | ICD-10-CM | POA: Diagnosis not present

## 2017-06-02 DIAGNOSIS — E538 Deficiency of other specified B group vitamins: Secondary | ICD-10-CM | POA: Diagnosis not present

## 2017-06-02 DIAGNOSIS — E1121 Type 2 diabetes mellitus with diabetic nephropathy: Secondary | ICD-10-CM | POA: Diagnosis not present

## 2017-06-10 DIAGNOSIS — E890 Postprocedural hypothyroidism: Secondary | ICD-10-CM | POA: Diagnosis not present

## 2017-07-21 DIAGNOSIS — E890 Postprocedural hypothyroidism: Secondary | ICD-10-CM | POA: Diagnosis not present

## 2017-08-04 DIAGNOSIS — E890 Postprocedural hypothyroidism: Secondary | ICD-10-CM | POA: Diagnosis not present

## 2017-11-18 DIAGNOSIS — H2513 Age-related nuclear cataract, bilateral: Secondary | ICD-10-CM | POA: Diagnosis not present

## 2017-11-18 DIAGNOSIS — H5203 Hypermetropia, bilateral: Secondary | ICD-10-CM | POA: Diagnosis not present

## 2017-11-18 DIAGNOSIS — E119 Type 2 diabetes mellitus without complications: Secondary | ICD-10-CM | POA: Diagnosis not present

## 2017-11-18 DIAGNOSIS — H524 Presbyopia: Secondary | ICD-10-CM | POA: Diagnosis not present

## 2017-11-25 DIAGNOSIS — E538 Deficiency of other specified B group vitamins: Secondary | ICD-10-CM | POA: Diagnosis not present

## 2017-11-25 DIAGNOSIS — E1121 Type 2 diabetes mellitus with diabetic nephropathy: Secondary | ICD-10-CM | POA: Diagnosis not present

## 2017-11-25 DIAGNOSIS — Z125 Encounter for screening for malignant neoplasm of prostate: Secondary | ICD-10-CM | POA: Diagnosis not present

## 2017-11-25 DIAGNOSIS — E039 Hypothyroidism, unspecified: Secondary | ICD-10-CM | POA: Diagnosis not present

## 2017-12-02 DIAGNOSIS — Z794 Long term (current) use of insulin: Secondary | ICD-10-CM | POA: Diagnosis not present

## 2017-12-02 DIAGNOSIS — E782 Mixed hyperlipidemia: Secondary | ICD-10-CM | POA: Diagnosis not present

## 2017-12-02 DIAGNOSIS — Z0000 Encounter for general adult medical examination without abnormal findings: Secondary | ICD-10-CM | POA: Diagnosis not present

## 2017-12-02 DIAGNOSIS — E039 Hypothyroidism, unspecified: Secondary | ICD-10-CM | POA: Diagnosis not present

## 2017-12-02 DIAGNOSIS — E1121 Type 2 diabetes mellitus with diabetic nephropathy: Secondary | ICD-10-CM | POA: Diagnosis not present

## 2017-12-02 DIAGNOSIS — E538 Deficiency of other specified B group vitamins: Secondary | ICD-10-CM | POA: Diagnosis not present

## 2018-02-16 DIAGNOSIS — D485 Neoplasm of uncertain behavior of skin: Secondary | ICD-10-CM | POA: Diagnosis not present

## 2018-02-16 DIAGNOSIS — L568 Other specified acute skin changes due to ultraviolet radiation: Secondary | ICD-10-CM | POA: Diagnosis not present

## 2018-02-16 DIAGNOSIS — D1801 Hemangioma of skin and subcutaneous tissue: Secondary | ICD-10-CM | POA: Diagnosis not present

## 2018-02-16 DIAGNOSIS — L570 Actinic keratosis: Secondary | ICD-10-CM | POA: Diagnosis not present

## 2018-02-16 DIAGNOSIS — Z85828 Personal history of other malignant neoplasm of skin: Secondary | ICD-10-CM | POA: Diagnosis not present

## 2018-02-16 DIAGNOSIS — L821 Other seborrheic keratosis: Secondary | ICD-10-CM | POA: Diagnosis not present

## 2018-02-16 DIAGNOSIS — Z872 Personal history of diseases of the skin and subcutaneous tissue: Secondary | ICD-10-CM | POA: Diagnosis not present

## 2018-03-02 DIAGNOSIS — E039 Hypothyroidism, unspecified: Secondary | ICD-10-CM | POA: Diagnosis not present

## 2018-03-23 DIAGNOSIS — L570 Actinic keratosis: Secondary | ICD-10-CM | POA: Diagnosis not present

## 2018-05-26 DIAGNOSIS — E039 Hypothyroidism, unspecified: Secondary | ICD-10-CM | POA: Diagnosis not present

## 2018-05-26 DIAGNOSIS — Z794 Long term (current) use of insulin: Secondary | ICD-10-CM | POA: Diagnosis not present

## 2018-05-26 DIAGNOSIS — E782 Mixed hyperlipidemia: Secondary | ICD-10-CM | POA: Diagnosis not present

## 2018-05-26 DIAGNOSIS — E1121 Type 2 diabetes mellitus with diabetic nephropathy: Secondary | ICD-10-CM | POA: Diagnosis not present

## 2018-05-26 DIAGNOSIS — E538 Deficiency of other specified B group vitamins: Secondary | ICD-10-CM | POA: Diagnosis not present

## 2018-06-08 DIAGNOSIS — E1121 Type 2 diabetes mellitus with diabetic nephropathy: Secondary | ICD-10-CM | POA: Diagnosis not present

## 2018-06-08 DIAGNOSIS — E113553 Type 2 diabetes mellitus with stable proliferative diabetic retinopathy, bilateral: Secondary | ICD-10-CM | POA: Diagnosis not present

## 2018-06-08 DIAGNOSIS — E538 Deficiency of other specified B group vitamins: Secondary | ICD-10-CM | POA: Diagnosis not present

## 2018-06-08 DIAGNOSIS — Z125 Encounter for screening for malignant neoplasm of prostate: Secondary | ICD-10-CM | POA: Diagnosis not present

## 2018-06-08 DIAGNOSIS — E039 Hypothyroidism, unspecified: Secondary | ICD-10-CM | POA: Diagnosis not present

## 2018-12-07 DIAGNOSIS — E538 Deficiency of other specified B group vitamins: Secondary | ICD-10-CM | POA: Diagnosis not present

## 2018-12-07 DIAGNOSIS — E1121 Type 2 diabetes mellitus with diabetic nephropathy: Secondary | ICD-10-CM | POA: Diagnosis not present

## 2018-12-07 DIAGNOSIS — Z125 Encounter for screening for malignant neoplasm of prostate: Secondary | ICD-10-CM | POA: Diagnosis not present

## 2018-12-08 DIAGNOSIS — E119 Type 2 diabetes mellitus without complications: Secondary | ICD-10-CM | POA: Diagnosis not present

## 2018-12-08 DIAGNOSIS — H2513 Age-related nuclear cataract, bilateral: Secondary | ICD-10-CM | POA: Diagnosis not present

## 2018-12-08 DIAGNOSIS — H52223 Regular astigmatism, bilateral: Secondary | ICD-10-CM | POA: Diagnosis not present

## 2018-12-08 DIAGNOSIS — H524 Presbyopia: Secondary | ICD-10-CM | POA: Diagnosis not present

## 2018-12-08 DIAGNOSIS — H5203 Hypermetropia, bilateral: Secondary | ICD-10-CM | POA: Diagnosis not present

## 2018-12-29 DIAGNOSIS — E113511 Type 2 diabetes mellitus with proliferative diabetic retinopathy with macular edema, right eye: Secondary | ICD-10-CM | POA: Diagnosis not present

## 2018-12-29 DIAGNOSIS — Z0000 Encounter for general adult medical examination without abnormal findings: Secondary | ICD-10-CM | POA: Diagnosis not present

## 2018-12-29 DIAGNOSIS — E538 Deficiency of other specified B group vitamins: Secondary | ICD-10-CM | POA: Diagnosis not present

## 2018-12-29 DIAGNOSIS — E782 Mixed hyperlipidemia: Secondary | ICD-10-CM | POA: Diagnosis not present

## 2018-12-29 DIAGNOSIS — I69352 Hemiplegia and hemiparesis following cerebral infarction affecting left dominant side: Secondary | ICD-10-CM | POA: Diagnosis not present

## 2019-02-15 DIAGNOSIS — Z85828 Personal history of other malignant neoplasm of skin: Secondary | ICD-10-CM | POA: Diagnosis not present

## 2019-02-15 DIAGNOSIS — D225 Melanocytic nevi of trunk: Secondary | ICD-10-CM | POA: Diagnosis not present

## 2019-02-15 DIAGNOSIS — L821 Other seborrheic keratosis: Secondary | ICD-10-CM | POA: Diagnosis not present

## 2019-02-15 DIAGNOSIS — D2262 Melanocytic nevi of left upper limb, including shoulder: Secondary | ICD-10-CM | POA: Diagnosis not present

## 2019-02-15 DIAGNOSIS — L308 Other specified dermatitis: Secondary | ICD-10-CM | POA: Diagnosis not present

## 2019-02-15 DIAGNOSIS — D2272 Melanocytic nevi of left lower limb, including hip: Secondary | ICD-10-CM | POA: Diagnosis not present

## 2019-02-15 DIAGNOSIS — Z08 Encounter for follow-up examination after completed treatment for malignant neoplasm: Secondary | ICD-10-CM | POA: Diagnosis not present

## 2019-02-15 DIAGNOSIS — D2261 Melanocytic nevi of right upper limb, including shoulder: Secondary | ICD-10-CM | POA: Diagnosis not present

## 2019-02-15 DIAGNOSIS — D2271 Melanocytic nevi of right lower limb, including hip: Secondary | ICD-10-CM | POA: Diagnosis not present

## 2019-06-21 DIAGNOSIS — E113511 Type 2 diabetes mellitus with proliferative diabetic retinopathy with macular edema, right eye: Secondary | ICD-10-CM | POA: Diagnosis not present

## 2019-06-21 DIAGNOSIS — E538 Deficiency of other specified B group vitamins: Secondary | ICD-10-CM | POA: Diagnosis not present

## 2019-06-29 DIAGNOSIS — I69352 Hemiplegia and hemiparesis following cerebral infarction affecting left dominant side: Secondary | ICD-10-CM | POA: Diagnosis not present

## 2019-06-29 DIAGNOSIS — E113511 Type 2 diabetes mellitus with proliferative diabetic retinopathy with macular edema, right eye: Secondary | ICD-10-CM | POA: Diagnosis not present

## 2019-06-29 DIAGNOSIS — E538 Deficiency of other specified B group vitamins: Secondary | ICD-10-CM | POA: Diagnosis not present

## 2019-06-29 DIAGNOSIS — E782 Mixed hyperlipidemia: Secondary | ICD-10-CM | POA: Diagnosis not present

## 2019-06-29 DIAGNOSIS — N183 Chronic kidney disease, stage 3 (moderate): Secondary | ICD-10-CM | POA: Diagnosis not present

## 2019-06-29 DIAGNOSIS — Z125 Encounter for screening for malignant neoplasm of prostate: Secondary | ICD-10-CM | POA: Diagnosis not present

## 2019-08-30 DIAGNOSIS — N183 Chronic kidney disease, stage 3 unspecified: Secondary | ICD-10-CM | POA: Diagnosis not present

## 2019-12-13 DIAGNOSIS — H5203 Hypermetropia, bilateral: Secondary | ICD-10-CM | POA: Diagnosis not present

## 2019-12-13 DIAGNOSIS — H524 Presbyopia: Secondary | ICD-10-CM | POA: Diagnosis not present

## 2019-12-13 DIAGNOSIS — H2513 Age-related nuclear cataract, bilateral: Secondary | ICD-10-CM | POA: Diagnosis not present

## 2019-12-13 DIAGNOSIS — E119 Type 2 diabetes mellitus without complications: Secondary | ICD-10-CM | POA: Diagnosis not present

## 2019-12-13 DIAGNOSIS — H52223 Regular astigmatism, bilateral: Secondary | ICD-10-CM | POA: Diagnosis not present

## 2019-12-20 DIAGNOSIS — E113511 Type 2 diabetes mellitus with proliferative diabetic retinopathy with macular edema, right eye: Secondary | ICD-10-CM | POA: Diagnosis not present

## 2019-12-20 DIAGNOSIS — E538 Deficiency of other specified B group vitamins: Secondary | ICD-10-CM | POA: Diagnosis not present

## 2019-12-20 DIAGNOSIS — Z125 Encounter for screening for malignant neoplasm of prostate: Secondary | ICD-10-CM | POA: Diagnosis not present

## 2020-01-03 DIAGNOSIS — Z0000 Encounter for general adult medical examination without abnormal findings: Secondary | ICD-10-CM | POA: Diagnosis not present

## 2020-01-03 DIAGNOSIS — Z87891 Personal history of nicotine dependence: Secondary | ICD-10-CM | POA: Diagnosis not present

## 2020-01-03 DIAGNOSIS — I69354 Hemiplegia and hemiparesis following cerebral infarction affecting left non-dominant side: Secondary | ICD-10-CM | POA: Diagnosis not present

## 2020-01-03 DIAGNOSIS — E119 Type 2 diabetes mellitus without complications: Secondary | ICD-10-CM | POA: Diagnosis not present

## 2020-01-03 DIAGNOSIS — E538 Deficiency of other specified B group vitamins: Secondary | ICD-10-CM | POA: Diagnosis not present

## 2020-02-14 DIAGNOSIS — D2272 Melanocytic nevi of left lower limb, including hip: Secondary | ICD-10-CM | POA: Diagnosis not present

## 2020-02-14 DIAGNOSIS — Z85828 Personal history of other malignant neoplasm of skin: Secondary | ICD-10-CM | POA: Diagnosis not present

## 2020-02-14 DIAGNOSIS — D2271 Melanocytic nevi of right lower limb, including hip: Secondary | ICD-10-CM | POA: Diagnosis not present

## 2020-02-14 DIAGNOSIS — L821 Other seborrheic keratosis: Secondary | ICD-10-CM | POA: Diagnosis not present

## 2020-02-14 DIAGNOSIS — D2261 Melanocytic nevi of right upper limb, including shoulder: Secondary | ICD-10-CM | POA: Diagnosis not present

## 2020-02-14 DIAGNOSIS — L853 Xerosis cutis: Secondary | ICD-10-CM | POA: Diagnosis not present

## 2020-06-20 DIAGNOSIS — E113512 Type 2 diabetes mellitus with proliferative diabetic retinopathy with macular edema, left eye: Secondary | ICD-10-CM | POA: Diagnosis not present

## 2020-06-20 DIAGNOSIS — E538 Deficiency of other specified B group vitamins: Secondary | ICD-10-CM | POA: Diagnosis not present

## 2020-06-26 DIAGNOSIS — N183 Chronic kidney disease, stage 3 unspecified: Secondary | ICD-10-CM | POA: Diagnosis not present

## 2020-06-26 DIAGNOSIS — E1122 Type 2 diabetes mellitus with diabetic chronic kidney disease: Secondary | ICD-10-CM | POA: Diagnosis not present

## 2020-06-26 DIAGNOSIS — Z125 Encounter for screening for malignant neoplasm of prostate: Secondary | ICD-10-CM | POA: Diagnosis not present

## 2020-06-26 DIAGNOSIS — E785 Hyperlipidemia, unspecified: Secondary | ICD-10-CM | POA: Diagnosis not present

## 2020-07-25 DIAGNOSIS — N1831 Chronic kidney disease, stage 3a: Secondary | ICD-10-CM | POA: Diagnosis not present

## 2020-11-24 ENCOUNTER — Emergency Department: Payer: Medicare HMO

## 2020-11-24 ENCOUNTER — Other Ambulatory Visit: Payer: Self-pay

## 2020-11-24 ENCOUNTER — Observation Stay: Payer: Medicare HMO

## 2020-11-24 ENCOUNTER — Observation Stay
Admission: EM | Admit: 2020-11-24 | Discharge: 2020-11-25 | Disposition: A | Discharge status: Home / Self Care | Payer: Medicare HMO | Attending: Internal Medicine | Admitting: Internal Medicine

## 2020-11-24 DIAGNOSIS — R338 Other retention of urine: Secondary | ICD-10-CM

## 2020-11-24 DIAGNOSIS — N179 Acute kidney failure, unspecified: Secondary | ICD-10-CM | POA: Insufficient documentation

## 2020-11-24 DIAGNOSIS — Z87891 Personal history of nicotine dependence: Secondary | ICD-10-CM | POA: Insufficient documentation

## 2020-11-24 DIAGNOSIS — Z20822 Contact with and (suspected) exposure to covid-19: Secondary | ICD-10-CM | POA: Insufficient documentation

## 2020-11-24 DIAGNOSIS — Z7984 Long term (current) use of oral hypoglycemic drugs: Secondary | ICD-10-CM | POA: Diagnosis not present

## 2020-11-24 DIAGNOSIS — R410 Disorientation, unspecified: Secondary | ICD-10-CM | POA: Diagnosis not present

## 2020-11-24 DIAGNOSIS — W19XXXA Unspecified fall, initial encounter: Secondary | ICD-10-CM | POA: Diagnosis not present

## 2020-11-24 DIAGNOSIS — Z79899 Other long term (current) drug therapy: Secondary | ICD-10-CM | POA: Diagnosis not present

## 2020-11-24 DIAGNOSIS — Z7982 Long term (current) use of aspirin: Secondary | ICD-10-CM | POA: Diagnosis not present

## 2020-11-24 DIAGNOSIS — E039 Hypothyroidism, unspecified: Secondary | ICD-10-CM | POA: Diagnosis not present

## 2020-11-24 DIAGNOSIS — R4182 Altered mental status, unspecified: Secondary | ICD-10-CM | POA: Diagnosis present

## 2020-11-24 DIAGNOSIS — R531 Weakness: Secondary | ICD-10-CM | POA: Diagnosis not present

## 2020-11-24 DIAGNOSIS — R9431 Abnormal electrocardiogram [ECG] [EKG]: Secondary | ICD-10-CM | POA: Diagnosis not present

## 2020-11-24 DIAGNOSIS — R2681 Unsteadiness on feet: Secondary | ICD-10-CM | POA: Diagnosis not present

## 2020-11-24 DIAGNOSIS — E119 Type 2 diabetes mellitus without complications: Secondary | ICD-10-CM | POA: Diagnosis not present

## 2020-11-24 DIAGNOSIS — Z043 Encounter for examination and observation following other accident: Secondary | ICD-10-CM | POA: Diagnosis not present

## 2020-11-24 DIAGNOSIS — Y92009 Unspecified place in unspecified non-institutional (private) residence as the place of occurrence of the external cause: Secondary | ICD-10-CM

## 2020-11-24 DIAGNOSIS — I10 Essential (primary) hypertension: Secondary | ICD-10-CM | POA: Diagnosis not present

## 2020-11-24 DIAGNOSIS — G9341 Metabolic encephalopathy: Secondary | ICD-10-CM

## 2020-11-24 DIAGNOSIS — R5381 Other malaise: Secondary | ICD-10-CM | POA: Diagnosis not present

## 2020-11-24 DIAGNOSIS — R29818 Other symptoms and signs involving the nervous system: Secondary | ICD-10-CM | POA: Diagnosis not present

## 2020-11-24 LAB — DIFFERENTIAL
Abs Immature Granulocytes: 0.06 10*3/uL (ref 0.00–0.07)
Basophils Absolute: 0.1 10*3/uL (ref 0.0–0.1)
Basophils Relative: 0 %
Eosinophils Absolute: 0.2 10*3/uL (ref 0.0–0.5)
Eosinophils Relative: 1 %
Immature Granulocytes: 1 %
Lymphocytes Relative: 17 %
Lymphs Abs: 2 10*3/uL (ref 0.7–4.0)
Monocytes Absolute: 0.9 10*3/uL (ref 0.1–1.0)
Monocytes Relative: 8 %
Neutro Abs: 8.7 10*3/uL — ABNORMAL HIGH (ref 1.7–7.7)
Neutrophils Relative %: 73 %

## 2020-11-24 LAB — PROTIME-INR
INR: 0.9 (ref 0.8–1.2)
Prothrombin Time: 11.8 seconds (ref 11.4–15.2)

## 2020-11-24 LAB — CBC
HCT: 38.4 % — ABNORMAL LOW (ref 39.0–52.0)
Hemoglobin: 12.1 g/dL — ABNORMAL LOW (ref 13.0–17.0)
MCH: 27.9 pg (ref 26.0–34.0)
MCHC: 31.5 g/dL (ref 30.0–36.0)
MCV: 88.7 fL (ref 80.0–100.0)
Platelets: 212 10*3/uL (ref 150–400)
RBC: 4.33 MIL/uL (ref 4.22–5.81)
RDW: 13.3 % (ref 11.5–15.5)
WBC: 11.8 10*3/uL — ABNORMAL HIGH (ref 4.0–10.5)
nRBC: 0 % (ref 0.0–0.2)

## 2020-11-24 LAB — URINALYSIS, COMPLETE (UACMP) WITH MICROSCOPIC
Bacteria, UA: NONE SEEN
Bilirubin Urine: NEGATIVE
Glucose, UA: NEGATIVE mg/dL
Hgb urine dipstick: NEGATIVE
Ketones, ur: NEGATIVE mg/dL
Leukocytes,Ua: NEGATIVE
Nitrite: NEGATIVE
Protein, ur: NEGATIVE mg/dL
Specific Gravity, Urine: 1.01 (ref 1.005–1.030)
pH: 5 (ref 5.0–8.0)

## 2020-11-24 LAB — COMPREHENSIVE METABOLIC PANEL
ALT: 20 U/L (ref 0–44)
AST: 23 U/L (ref 15–41)
Albumin: 4.4 g/dL (ref 3.5–5.0)
Alkaline Phosphatase: 65 U/L (ref 38–126)
Anion gap: 10 (ref 5–15)
BUN: 30 mg/dL — ABNORMAL HIGH (ref 8–23)
CO2: 25 mmol/L (ref 22–32)
Calcium: 9.1 mg/dL (ref 8.9–10.3)
Chloride: 106 mmol/L (ref 98–111)
Creatinine, Ser: 2.38 mg/dL — ABNORMAL HIGH (ref 0.61–1.24)
GFR, Estimated: 27 mL/min — ABNORMAL LOW (ref >60)
Glucose, Bld: 140 mg/dL — ABNORMAL HIGH (ref 70–99)
Potassium: 4.4 mmol/L (ref 3.5–5.1)
Sodium: 141 mmol/L (ref 135–145)
Total Bilirubin: 0.6 mg/dL (ref 0.3–1.2)
Total Protein: 7.9 g/dL (ref 6.5–8.1)

## 2020-11-24 LAB — RESP PANEL BY RT-PCR (FLU A&B, COVID) ARPGX2
Influenza A by PCR: NEGATIVE
Influenza B by PCR: NEGATIVE
SARS Coronavirus 2 by RT PCR: NEGATIVE

## 2020-11-24 LAB — CBG MONITORING, ED: Glucose-Capillary: 150 mg/dL — ABNORMAL HIGH (ref 70–99)

## 2020-11-24 LAB — HEMOGLOBIN A1C
Hgb A1c MFr Bld: 7.2 % — ABNORMAL HIGH (ref 4.8–5.6)
Mean Plasma Glucose: 159.94 mg/dL

## 2020-11-24 LAB — APTT: aPTT: 33 seconds (ref 24–36)

## 2020-11-24 IMAGING — DX DG CHEST 1V PORT
1 series · 1 of 1 positions shown · non-contrast
Comparison: None.

CLINICAL DATA: Weakness, confusion and falling.

EXAM:
PORTABLE CHEST 1 VIEW

[chest ap]
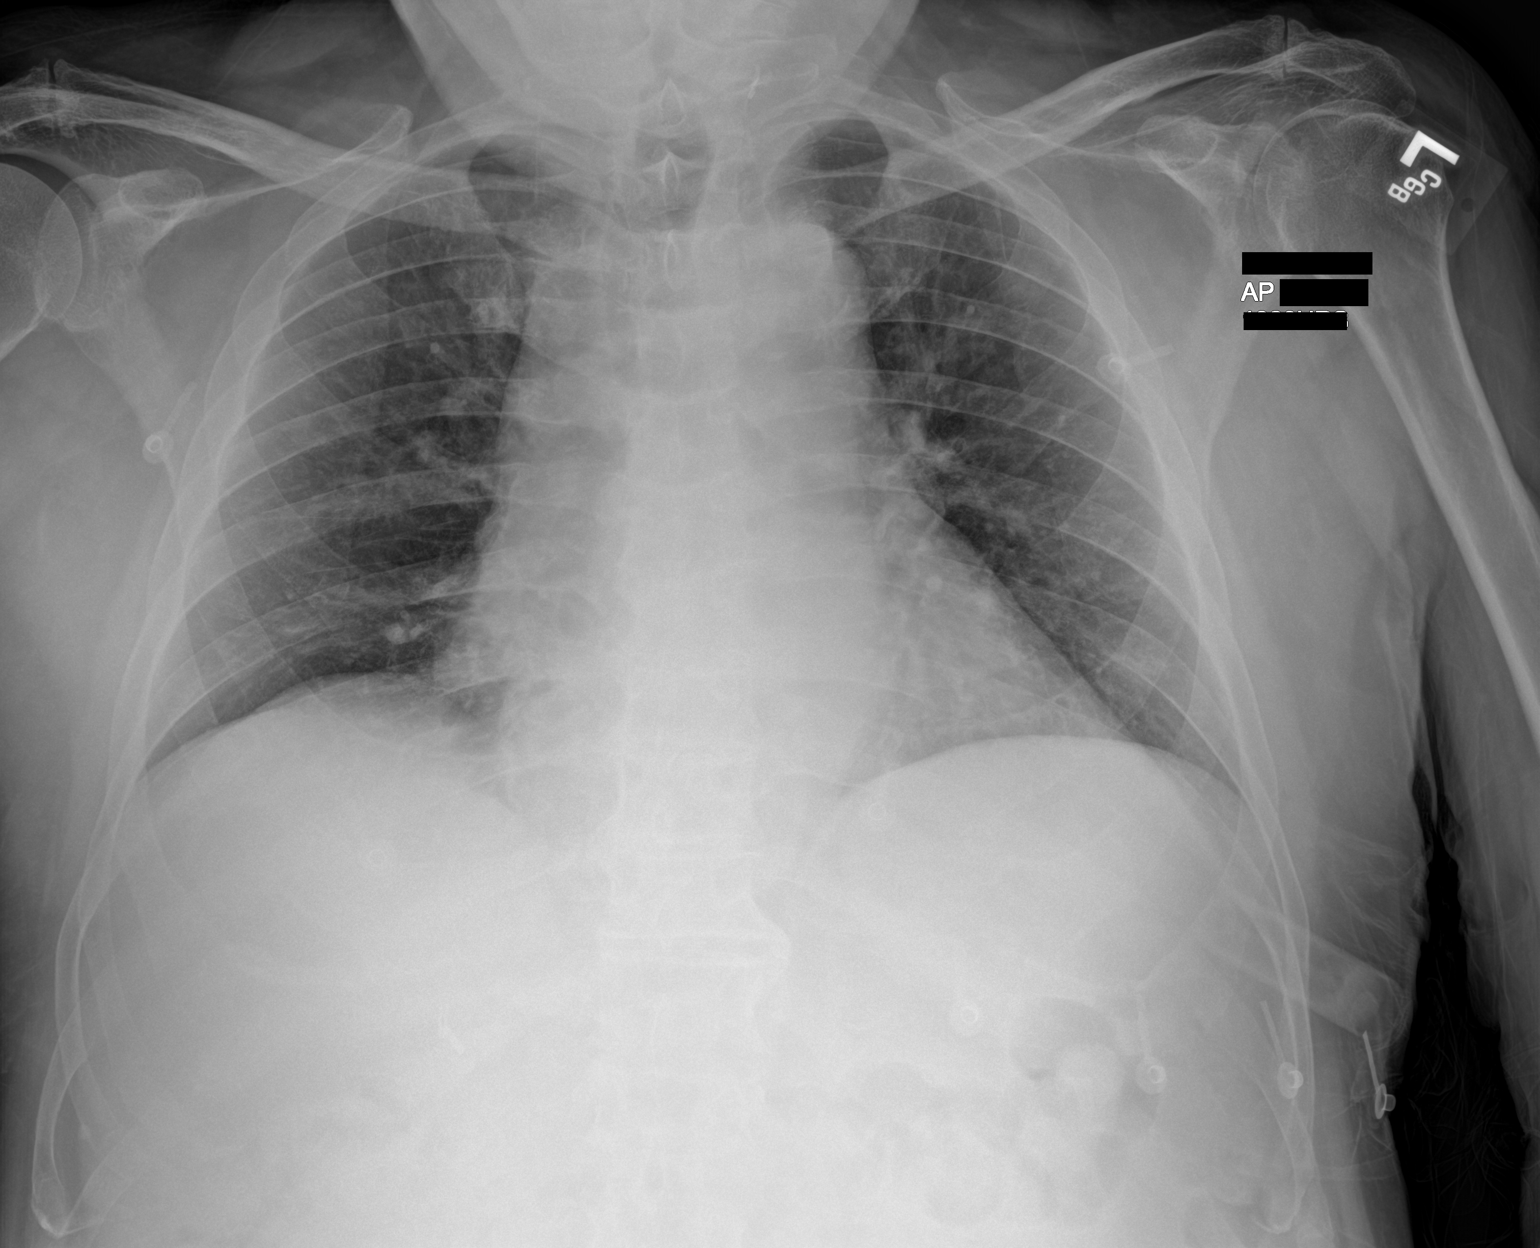

[1 of 1 positions shown; findings below may reference images not displayed]

FINDINGS: Poor inspiration. Heart size upper limits of normal. Aortic
atherosclerotic calcification. The lungs are clear. No effusions. No
acute bone finding.
IMPRESSION: No active disease. Poor inspiration. Aortic atherosclerotic
calcification.

## 2020-11-24 IMAGING — CT CT HEAD W/O CM
4 series · 16 of 47 positions shown, 18 images · non-contrast
Comparison: None.

CLINICAL DATA: Neuro deficit, acute stroke suspected.

EXAM:
CT HEAD WITHOUT CONTRAST
TECHNIQUE: Contiguous axial images were obtained from the base of the skull
through the vertex without intravenous contrast.

[Series 2: head wo · axial · 0.44mm/px · z∈[-117,+3]mm · 7 of 32 slices shown, 9 images]
[im 4/32  brain]
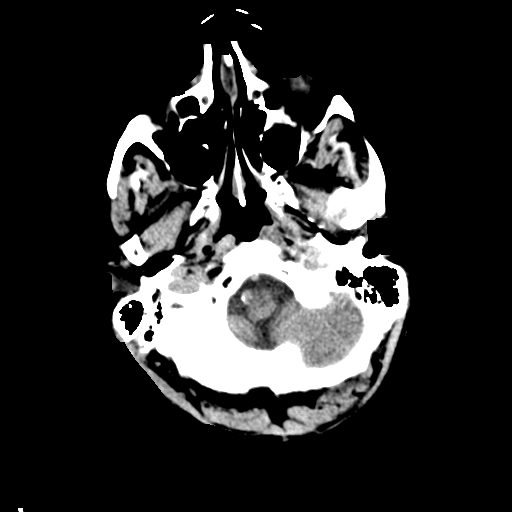
[im 4/32  bone]
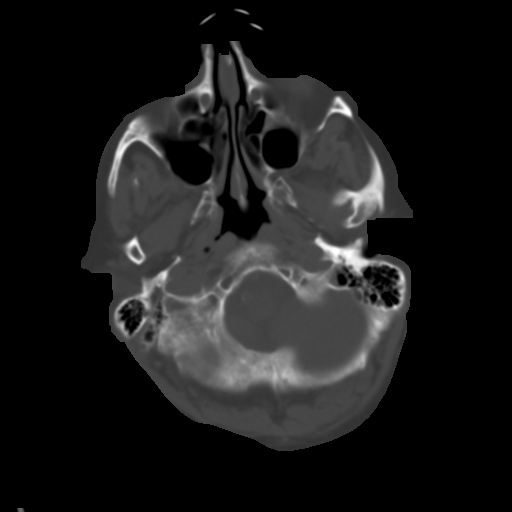
[im 8/32  brain]
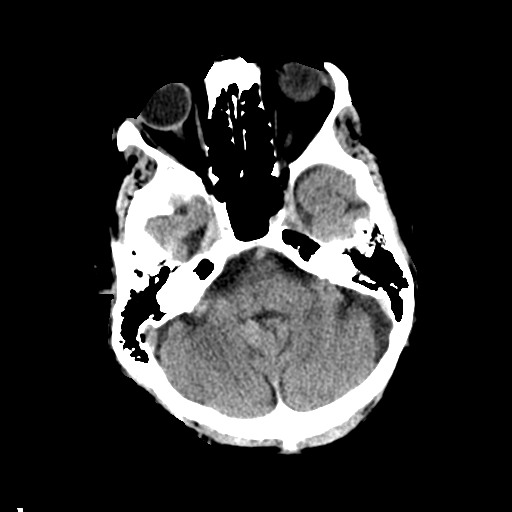
[im 12/32  brain]
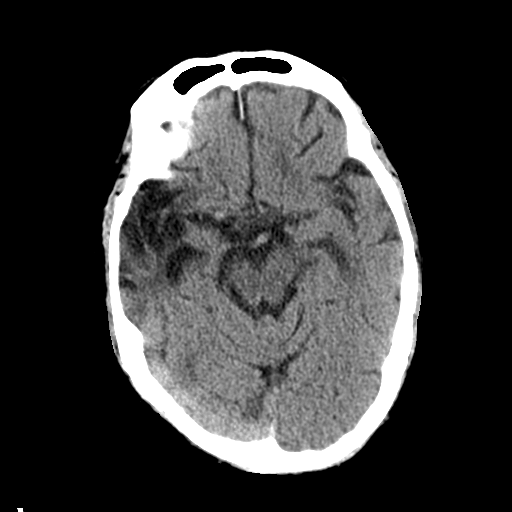
[im 16/32  brain]
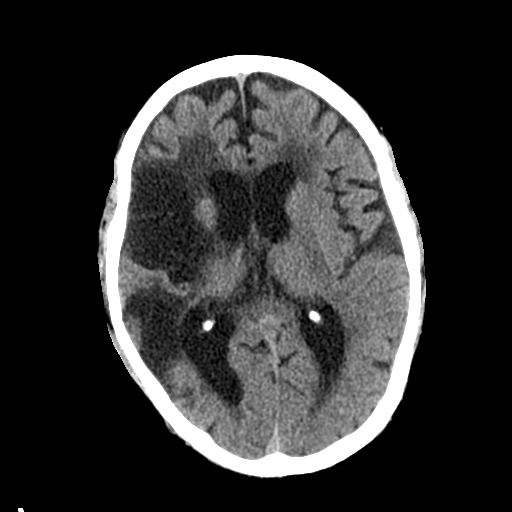
[im 20/32  brain]
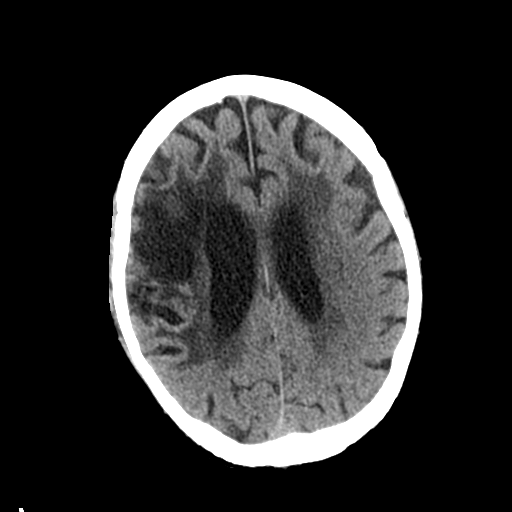
[im 20/32  bone]
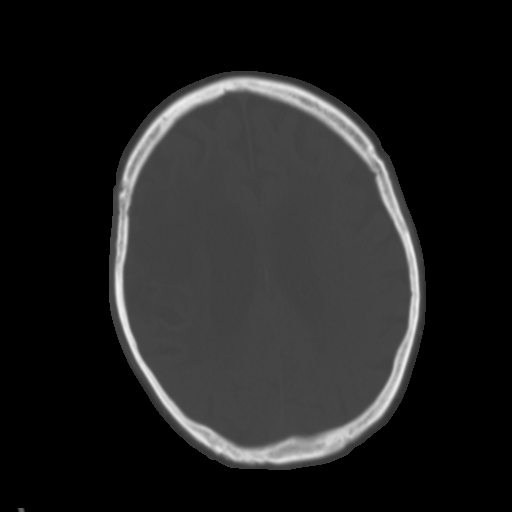
[im 24/32  brain]
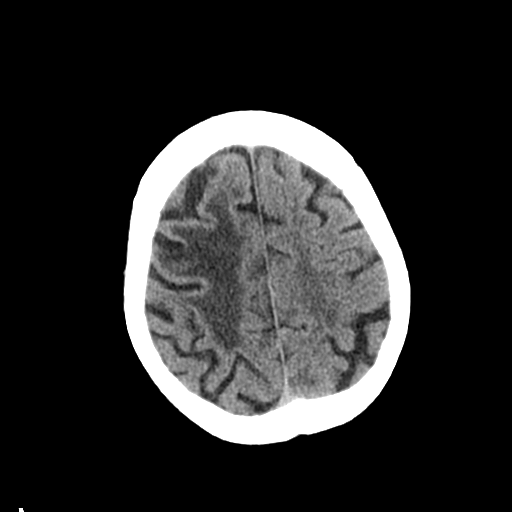
[im 28/32  brain]
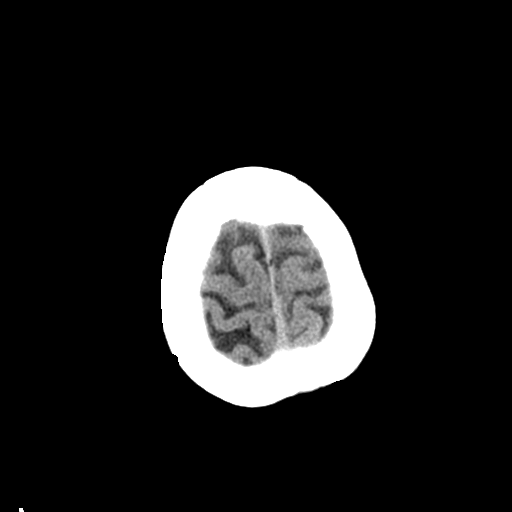

[Series 3: head bone · axial · 0.44mm/px · z∈[-118,-86]mm · 3 of 80 slices shown]
[im 8/80  bone]
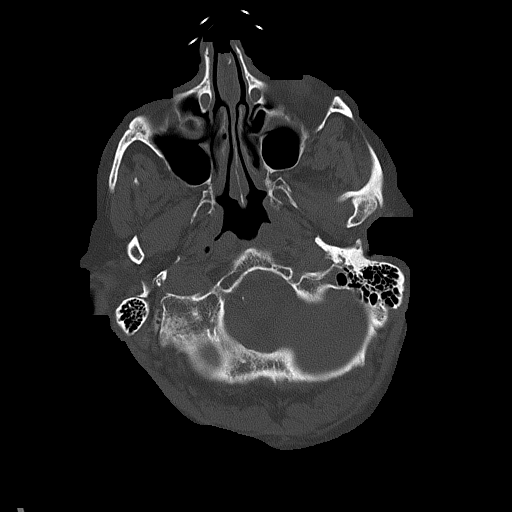
[im 16/80  bone]
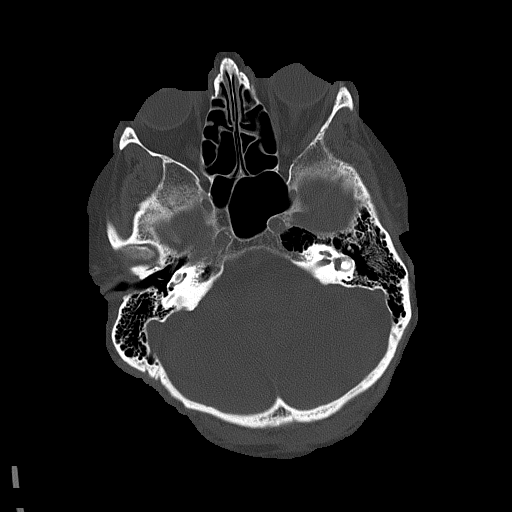
[im 24/80  bone]
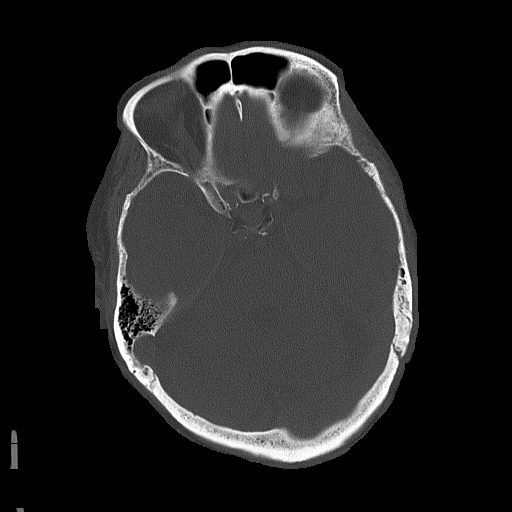

[Series 4: coronal soft tissue · coronal · 0.31mm/px · 3 of 65 slices shown]
[im 22/65  brain]
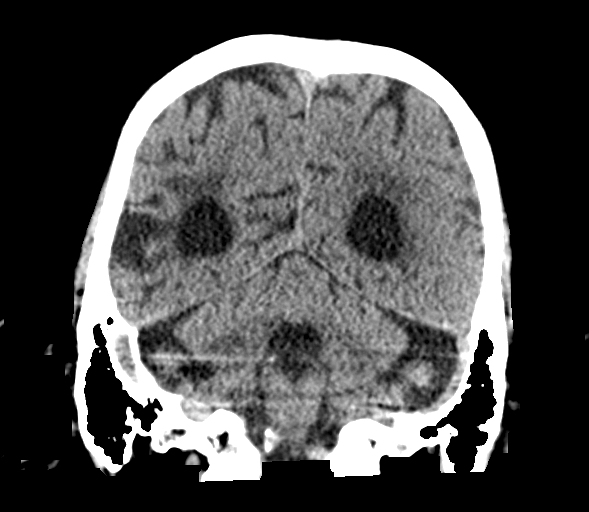
[im 29/65  brain]
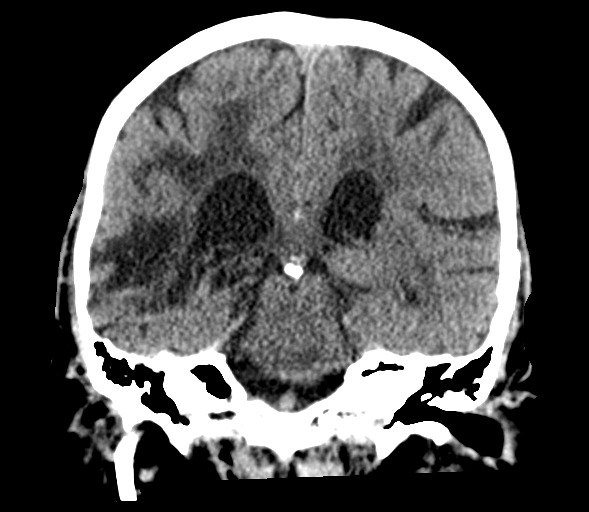
[im 36/65  brain]
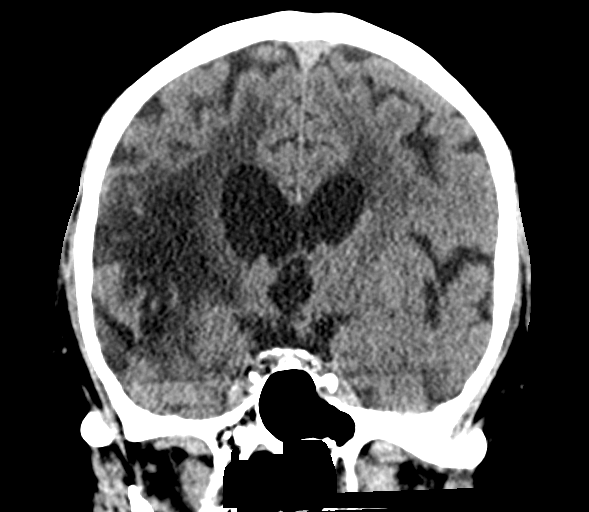

[Series 5: sagittal soft tissue · sagittal · 0.31mm/px · 3 of 52 slices shown]
[im 18/52  brain]
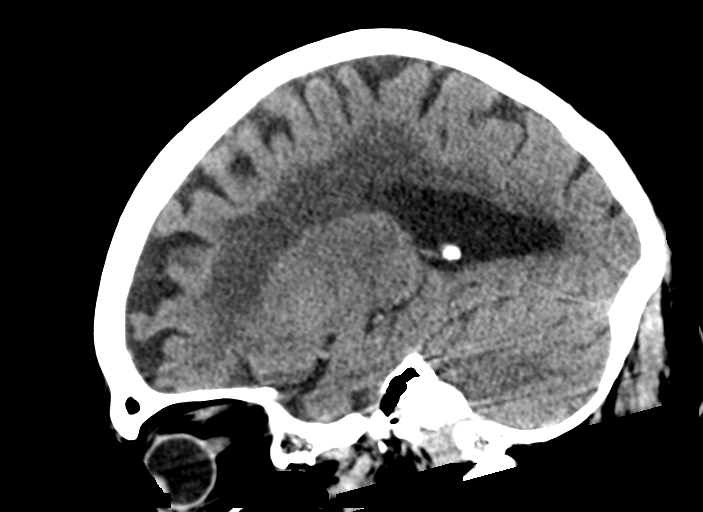
[im 26/52  brain]
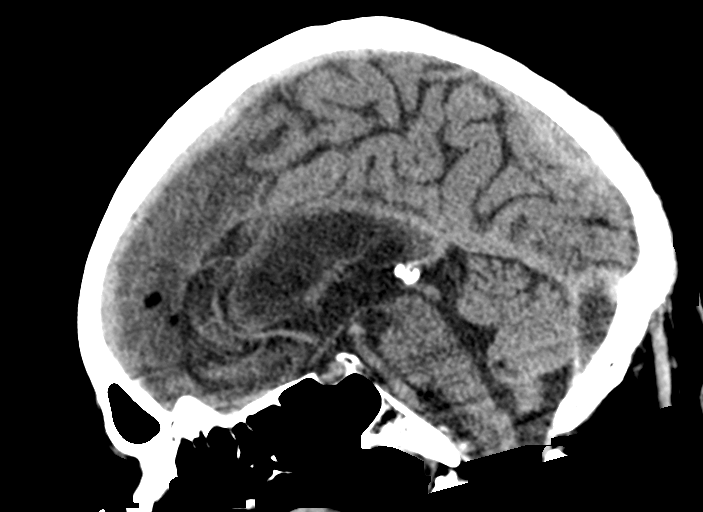
[im 35/52  brain]
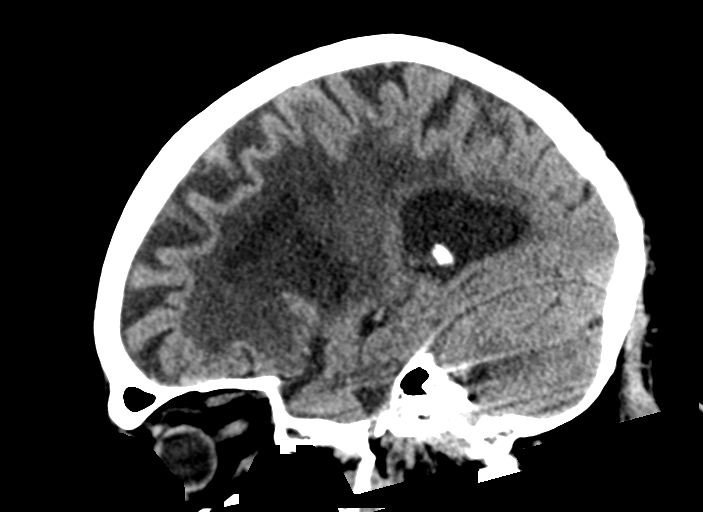

[16 of 47 positions shown; findings below may reference images not displayed]

FINDINGS: Brain: Large remote right MCA territory infarct with
encephalomalacia and gliosis involving the right frontal, parietal
and anterior temporal lobes. Ex vacuo ventricular dilation of the
right lateral ventricle. No specific evidence of acute large
vascular territory infarct. Scattered additional white matter
hypoattenuation, compatible with chronic microvascular ischemic
disease. Generalized cerebral atrophy. No acute hemorrhage. No
abnormal mass effect. No evidence of extra-axial fluid collection.
No mass lesion.

Vascular: Calcific atherosclerosis.

Skull: Acute fracture.

Sinuses/Orbits: Visualized sinuses are clear.  Unremarkable orbits.

Other: No mastoid effusions.
IMPRESSION: 1. No evidence of acute intracranial abnormality.
2. Large remote right MCA territory infarct.
3. Chronic microvascular ischemic disease and generalized atrophy.

## 2020-11-24 IMAGING — MR MR HEAD W/O CM
11 series · 48 of 48 positions shown · non-contrast
Comparison: None.

CLINICAL DATA: Encephalopathy and fall.

EXAM:
MRI HEAD WITHOUT CONTRAST
TECHNIQUE: Multiplanar, multiecho pulse sequences of the brain and surrounding
structures were obtained without intravenous contrast.

[Series 2: ax dwi_tracew · axial · 3.0mm · 1.31mm/px · z∈[-79,+76]mm · 4 of 48 slices shown]
[im 1/48]
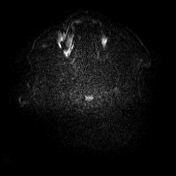
[im 16/48]
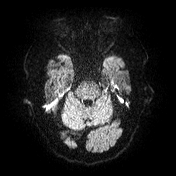
[im 32/48]
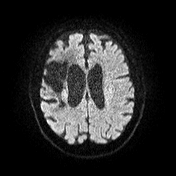
[im 48/48]
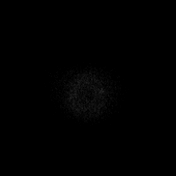

[Series 3: ax dwi_adc · axial · 3.0mm · 1.31mm/px · z∈[-79,+76]mm · 4 of 48 slices shown]
[im 1/48]
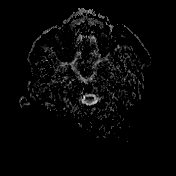
[im 16/48]
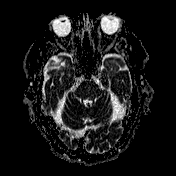
[im 32/48]
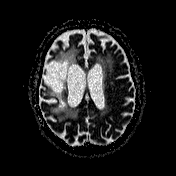
[im 48/48]
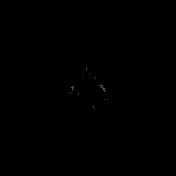

[Series 4: cor dwi_tracew · coronal · 5.0mm · 1.31mm/px · 3 of 38 slices shown]
[im 1/38]
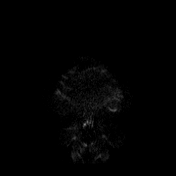
[im 19/38]
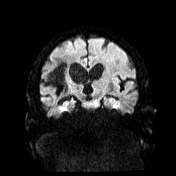
[im 38/38]
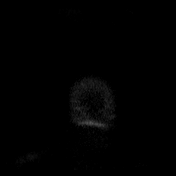

[Series 5: cor dwi_adc · coronal · 5.0mm · 1.31mm/px · 3 of 38 slices shown]
[im 1/38]
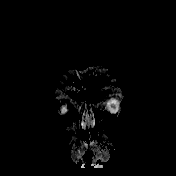
[im 19/38]
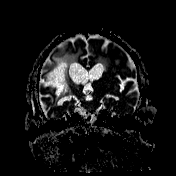
[im 38/38]
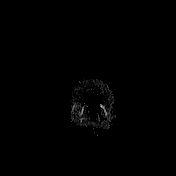

[Series 6: T1 · sagittal · 5.0mm · 0.94mm/px · 2 of 23 slices shown (1 of 2)]
[im 1/23]
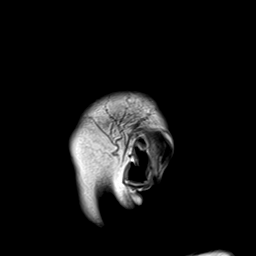
[im 23/23]
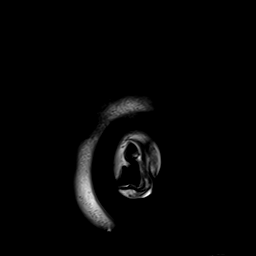

[Series 7: T2 · axial · 5.0mm · 0.45mm/px · z∈[-79,+77]mm · 2 of 27 slices shown (1 of 2)]
[im 1/27]
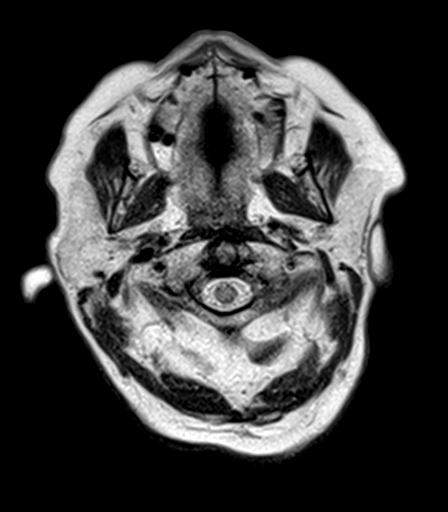
[im 27/27]
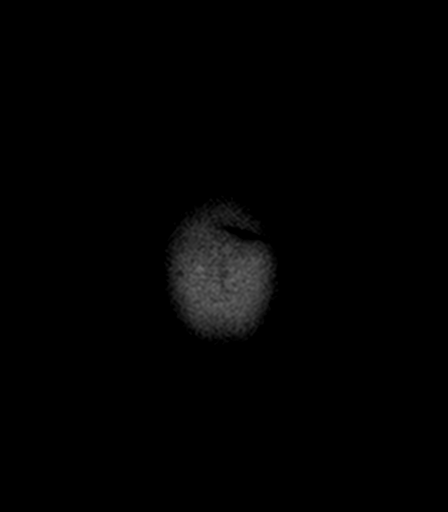

[Series 9: pha_images · axial · 3.0mm · 0.90mm/px · z∈[-72,+75]mm · 4 of 50 slices shown]
[im 1/50]
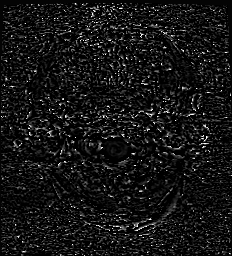
[im 17/50]
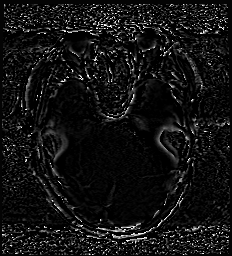
[im 33/50]
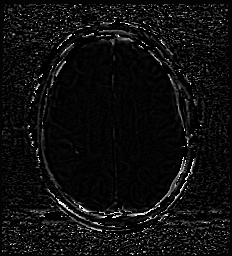
[im 50/50]
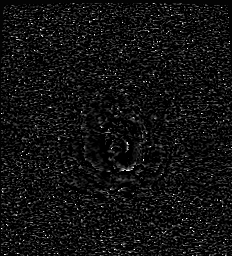

[Series 10: swi_images · axial · 3.0mm · 0.90mm/px · z∈[-78,+75]mm · 4 of 52 slices shown]
[im 1/52]
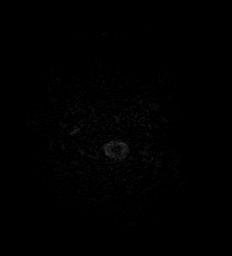
[im 18/52]
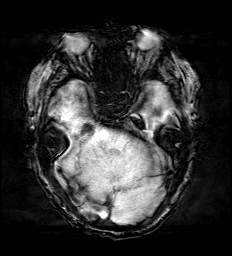
[im 35/52]
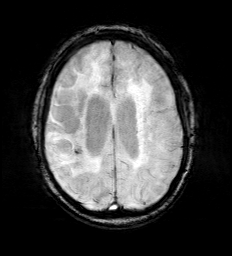
[im 52/52]
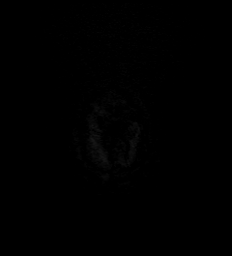

[Series 12: FLAIR · axial · 3.0mm · 0.69mm/px · z∈[-83,+79]mm · 5 of 55 slices shown]
[im 1/55]
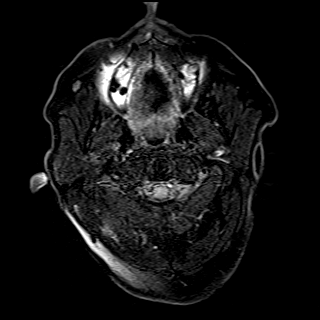
[im 14/55]
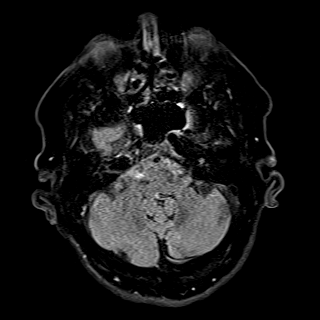
[im 28/55]
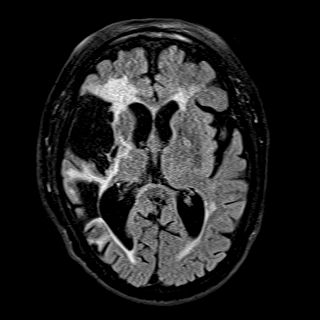
[im 41/55]
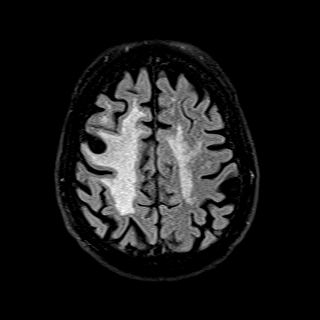
[im 55/55]
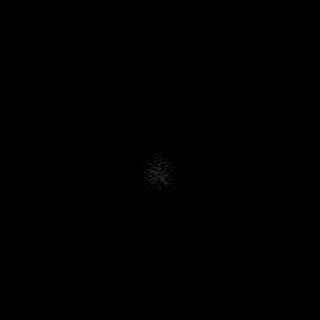

[Series 13: T1 · axial · 1.0mm · 0.98mm/px · z∈[-89,+86]mm · 14 of 174 slices shown (2 of 2)]
[im 1/174]
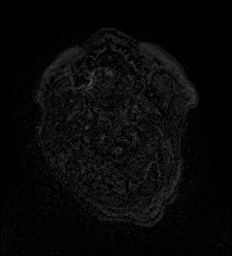
[im 14/174]
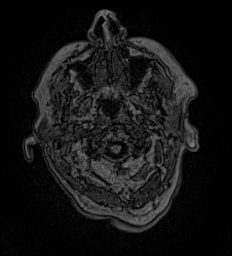
[im 27/174]
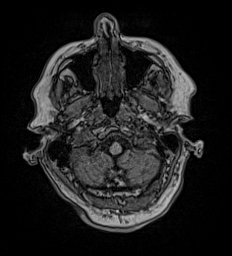
[im 40/174]
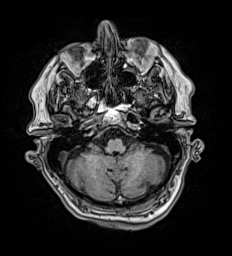
[im 54/174]
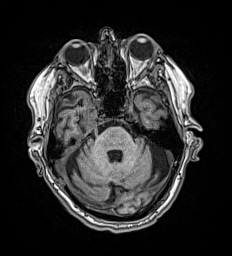
[im 67/174]
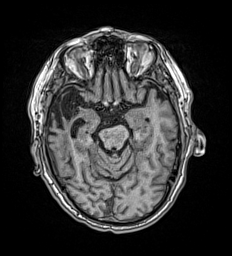
[im 80/174]
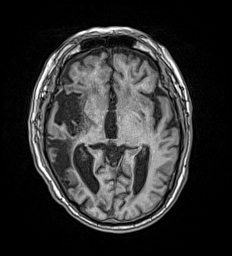
[im 94/174]
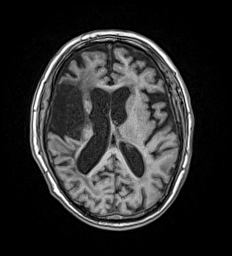
[im 107/174]
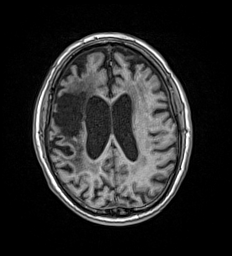
[im 120/174]
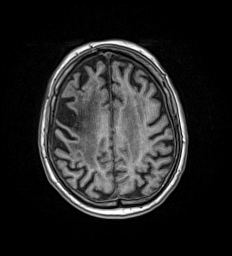
[im 134/174]
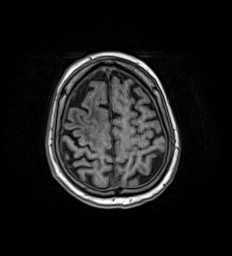
[im 147/174]
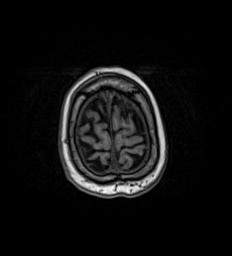
[im 160/174]
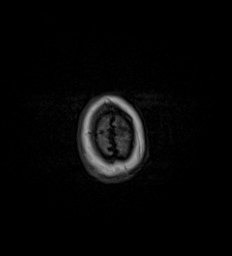
[im 174/174]
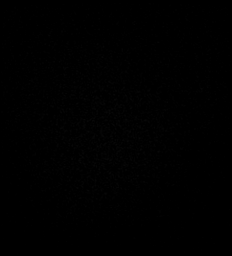

[Series 14: T2 · coronal · 5.0mm · 0.45mm/px · 3 of 31 slices shown (2 of 2)]
[im 1/31]
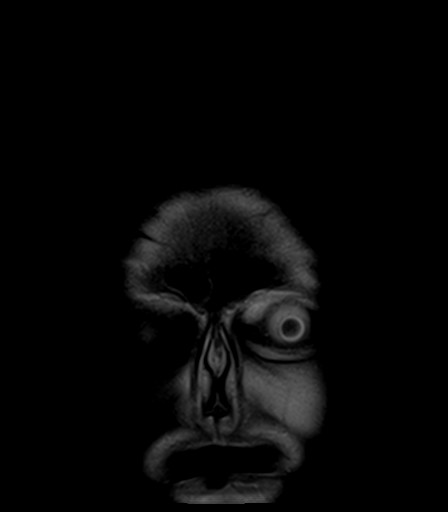
[im 16/31]
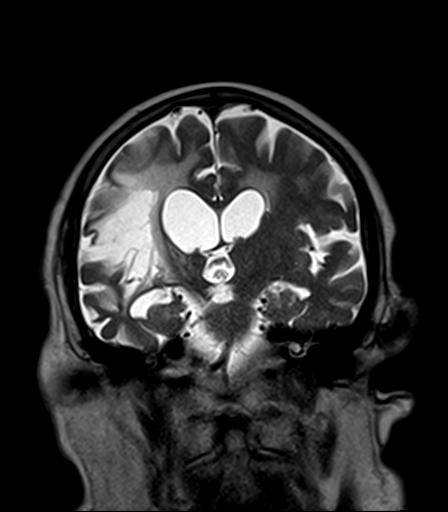
[im 31/31]
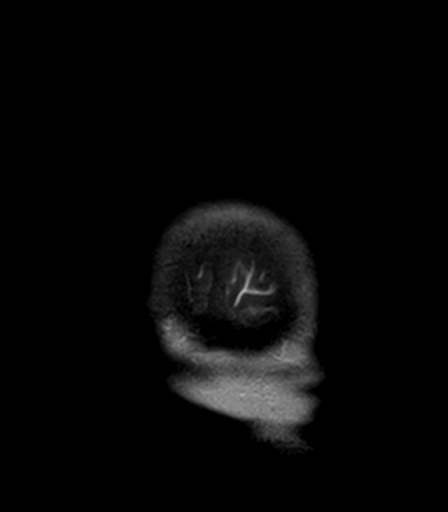

[48 of 48 positions shown; findings below may reference images not displayed]

FINDINGS: Brain: No acute infarct, mass effect or extra-axial collection. No
acute hemorrhage. Hemosiderin deposition adjacent to large area of
encephalomalacia at the site of chronic right MCA territory infarct.
Confluent hyperintense T2-weighted white matter signal. Generalized
volume loss without a clear lobar predilection. The midline
structures are normal.

Vascular: Major flow voids are preserved.

Skull and upper cervical spine: Normal calvarium and skull base.
Visualized upper cervical spine and soft tissues are normal.

Sinuses/Orbits:No paranasal sinus fluid levels or advanced mucosal
thickening. No mastoid or middle ear effusion. Normal orbits.
IMPRESSION: 1. No acute intracranial abnormality.
2. Chronic right MCA territory infarct and findings of chronic small
vessel disease.

## 2020-11-24 MED ORDER — HEPARIN SODIUM (PORCINE) 5000 UNIT/ML IJ SOLN
5000.0000 [IU] | Freq: Three times a day (TID) | INTRAMUSCULAR | Status: DC
  Administered 2020-11-24 – 2020-11-25 (×2): 5000 [IU] via SUBCUTANEOUS
  Filled 2020-11-24 (×2): qty 1

## 2020-11-24 MED ORDER — LACTATED RINGERS IV SOLN: INTRAVENOUS | Status: DC

## 2020-11-24 MED ORDER — SODIUM CHLORIDE 0.9% FLUSH
3.0000 mL | Freq: Once | INTRAVENOUS | Status: AC
  Administered 2020-11-24: 3 mL via INTRAVENOUS

## 2020-11-24 MED ORDER — ASPIRIN EC 81 MG PO TBEC
81.0000 mg | DELAYED_RELEASE_TABLET | Freq: Every day | ORAL | Status: DC
  Administered 2020-11-24 – 2020-11-25 (×2): 81 mg via ORAL
  Filled 2020-11-24 (×2): qty 1

## 2020-11-24 MED ORDER — CITALOPRAM HYDROBROMIDE 20 MG PO TABS
20.0000 mg | ORAL_TABLET | Freq: Every day | ORAL | Status: DC
  Administered 2020-11-25: 12:00:00 20 mg via ORAL
  Filled 2020-11-24: qty 1

## 2020-11-24 MED ORDER — VITAMIN B-12 1000 MCG PO TABS
1000.0000 ug | ORAL_TABLET | Freq: Every day | ORAL | Status: DC
  Administered 2020-11-25: 09:00:00 1000 ug via ORAL
  Filled 2020-11-24: qty 1

## 2020-11-24 MED ORDER — INSULIN ASPART 100 UNIT/ML ~~LOC~~ SOLN
0.0000 [IU] | Freq: Three times a day (TID) | SUBCUTANEOUS | Status: DC
  Administered 2020-11-24: 18:00:00 1 [IU] via SUBCUTANEOUS
  Administered 2020-11-25: 12:00:00 2 [IU] via SUBCUTANEOUS
  Filled 2020-11-24 (×2): qty 1

## 2020-11-24 MED ORDER — PANTOPRAZOLE SODIUM 40 MG PO TBEC
40.0000 mg | DELAYED_RELEASE_TABLET | Freq: Every day | ORAL | Status: DC
  Administered 2020-11-25: 09:00:00 40 mg via ORAL
  Filled 2020-11-24 (×2): qty 1

## 2020-11-24 MED ORDER — LEVOTHYROXINE SODIUM 50 MCG PO TABS
100.0000 ug | ORAL_TABLET | Freq: Every day | ORAL | Status: DC
  Administered 2020-11-25: 06:00:00 100 ug via ORAL
  Filled 2020-11-24: qty 2

## 2020-11-24 MED ORDER — SODIUM CHLORIDE 0.9 % IV SOLN
1.0000 g | INTRAVENOUS | Status: DC
  Administered 2020-11-24: 18:00:00 1 g via INTRAVENOUS
  Filled 2020-11-24: qty 10

## 2020-11-24 MED ORDER — SODIUM CHLORIDE 0.9 % IV BOLUS
1000.0000 mL | Freq: Once | INTRAVENOUS | Status: AC
  Administered 2020-11-24: 1000 mL via INTRAVENOUS

## 2020-11-24 MED ORDER — GABAPENTIN 300 MG PO CAPS
300.0000 mg | ORAL_CAPSULE | Freq: Two times a day (BID) | ORAL | Status: DC
  Administered 2020-11-24 – 2020-11-25 (×2): 300 mg via ORAL
  Filled 2020-11-24 (×2): qty 1

## 2020-11-24 MED ORDER — SIMVASTATIN 20 MG PO TABS
20.0000 mg | ORAL_TABLET | Freq: Every day | ORAL | Status: DC
  Administered 2020-11-24: 18:00:00 20 mg via ORAL
  Filled 2020-11-24: qty 2

## 2020-11-24 MED ORDER — MELATONIN 5 MG PO TABS
5.0000 mg | ORAL_TABLET | Freq: Every day | ORAL | Status: DC
  Administered 2020-11-24: 23:00:00 5 mg via ORAL
  Filled 2020-11-24: qty 1

## 2020-11-24 NOTE — Progress Notes (Signed)
Patient sat on edge of bed and tolerated thin fluids. No gagging, coughing, choking, or inadequate throat clearance noted during swallowing.
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
## 2020-11-24 NOTE — ED Triage Notes (Signed)
Pt comes into the ED via EMS from home with AMS. Pt is orient to self only. CBG 198  Per pt wife, states the pr fell last night and they had to call for assistance getting him up . States this morning she had a hard time waking him and he is not able to stand on his own,. States he has a hx of stroke in the past,  States his current mental status is not his baseline

## 2020-11-24 NOTE — ED Notes (Signed)
Bladder scanned post void was .

## 2020-11-24 NOTE — H&P (Signed)
Eric Lopez is an 79 y.o. male.    Status is: Observation  The patient remains OBS appropriate and will d/c before 2 midnights.  Dispo: The patient is from: Home              Anticipated d/c is to: Home              Anticipated d/c date is: 1 day              Patient currently is not medically stable to d/c.   Chief Complaint: Altered mental status. HPI:  Eric Lopez is a 79 year old male with history of CVA, type 2 diabetes, dyslipidemia, hypothyroidism, iron deficient anemia, who present to the hospital with altered mental status.  He was a found to have acute kidney injury, also had urinary retention 710 mL.  Foley cath will be anchored.  Patient be admitted to hospital for observation. Patient had a stroke several years back, he also had surgery for severe carotid stenosis.  He has a residual left-sided weakness, but he was able to walk slowly without a walker.  He had a fall yesterday in his restroom, he had some weakness at that time.  No injury occurred.  This morning, wife found patient was sleeping and hard to wake up.  When he woke up, his speech is slurred.  He could barely stand. In the emergency room, he had normal vital signs.  He had acute kidney failure with creatinine 2.38 with normal renal function recently.  UA and urine culture sent out and pending results.  Past Medical History:  Diagnosis Date  . B12 deficiency   . CVA (cerebral infarction)    left hemiparesis  . Diabetes mellitus   . Hematuria   . Hyperlipidemia   . Hypothyroidism   . IDA (iron deficiency anemia) 04/04/2015  . Osteoarthritis   . Stroke ([place])   . Type 2 diabetes mellitus with renal manifestations, controlled ([place])   . Vitiligo     Past Surgical History:  Procedure Laterality Date  . CAROTID ENDARTERECTOMY    . CERVICAL SPINE SURGERY    . CHOLECYSTECTOMY    . COLONOSCOPY WITH PROPOFOL N/A 01/30/2016   Procedure: COLONOSCOPY WITH PROPOFOL;  Surgeon: Manya Silvas, MD;  Location: [facility]  ENDOSCOPY;  Service: Endoscopy;  Laterality: N/A;  . LAMINOTOMY / EXCISION DISK POSTERIOR CERVICAL SPINE    . NOSE SURGERY      No family history on file. Social History:  reports that he quit smoking about 21 years ago. His smoking use included pipe and cigars. He has a 35.00 pack-year smoking history. He has never used smokeless tobacco. He reports that he does not drink alcohol and does not use drugs.  Allergies:  Allergies  Allergen Reactions  . Sulfa Antibiotics Itching and Rash    (Not in a hospital admission)   Results for orders placed or performed during the hospital encounter of 11/24/20 (from the past 48 hour(s))  Protime-INR     Status: None   Collection Time: 11/24/20 11:23 AM  Result Value Ref Range   Prothrombin Time 11.8 11.4 - 15.2 seconds   INR 0.9 0.8 - 1.2    Comment: (NOTE) INR goal varies based on device and disease states. Performed at [facility], [address] [place], [place] [address]   APTT     Status: None   Collection Time: 11/24/20 11:23 AM  Result Value Ref Range   aPTT 33 24 - 36 seconds  Comment: Performed at [facility], [address] [place], [place] [address]  CBC     Status: Abnormal   Collection Time: 11/24/20 11:23 AM  Result Value Ref Range   WBC 11.8 (H) 4.0 - 10.5 K/uL   RBC 4.33 4.22 - 5.81 MIL/uL   Hemoglobin 12.1 (L) 13.0 - 17.0 g/dL   HCT 38.4 (L) 39.0 - 52.0 %   MCV 88.7 80.0 - 100.0 fL   MCH 27.9 26.0 - 34.0 pg   MCHC 31.5 30.0 - 36.0 g/dL   RDW 13.3 11.5 - 15.5 %   Platelets 212 150 - 400 K/uL   nRBC 0.0 0.0 - 0.2 %    Comment: Performed at [facility], [address] [place], [place] [address]  Differential     Status: Abnormal   Collection Time: 11/24/20 11:23 AM  Result Value Ref Range   Neutrophils Relative % 73 %   Neutro Abs 8.7 (H) 1.7 - 7.7 K/uL   Lymphocytes Relative 17 %   Lymphs Abs 2.0 0.7 - 4.0 K/uL   Monocytes Relative 8 %   Monocytes Absolute 0.9 0.1 - 1.0  K/uL   Eosinophils Relative 1 %   Eosinophils Absolute 0.2 0.0 - 0.5 K/uL   Basophils Relative 0 %   Basophils Absolute 0.1 0.0 - 0.1 K/uL   Immature Granulocytes 1 %   Abs Immature Granulocytes 0.06 0.00 - 0.07 K/uL    Comment: Performed at [facility], [address] [place], [place] [address]  Comprehensive metabolic panel     Status: Abnormal   Collection Time: 11/24/20 11:23 AM  Result Value Ref Range   Sodium 141 135 - 145 mmol/L   Potassium 4.4 3.5 - 5.1 mmol/L   Chloride 106 98 - 111 mmol/L   CO2 25 22 - 32 mmol/L   Glucose, Bld 140 (H) 70 - 99 mg/dL    Comment: Glucose reference range applies only to samples taken after fasting for at least 8 hours.   BUN 30 (H) 8 - 23 mg/dL   Creatinine, Ser 2.38 (H) 0.61 - 1.24 mg/dL   Calcium 9.1 8.9 - 10.3 mg/dL   Total Protein 7.9 6.5 - 8.1 g/dL   Albumin 4.4 3.5 - 5.0 g/dL   AST 23 15 - 41 U/L   ALT 20 0 - 44 U/L   Alkaline Phosphatase 65 38 - 126 U/L   Total Bilirubin 0.6 0.3 - 1.2 mg/dL   GFR, Estimated 27 (L) >60 mL/min    Comment: (NOTE) Calculated using the CKD-EPI Creatinine Equation (2021)    Anion gap 10 5 - 15    Comment: Performed at [facility], [address], [place], [place] [address]  Resp Panel by RT-PCR (Flu A&B, Covid) Nasopharyngeal Swab     Status: None   Collection Time: 11/24/20  1:15 PM   Specimen: Nasopharyngeal Swab; Nasopharyngeal(NP) swabs in vial transport medium  Result Value Ref Range   SARS Coronavirus 2 by RT PCR NEGATIVE NEGATIVE    Comment: (NOTE) SARS-CoV-2 target nucleic acids are NOT DETECTED.  The SARS-CoV-2 RNA is generally detectable in upper respiratory specimens during the acute phase of infection. The lowest concentration of SARS-CoV-2 viral copies this assay can detect is 138 copies/mL. A negative result does not preclude SARS-Cov-2 infection and should not be used as the sole basis for treatment or other patient management decisions. A negative result may  occur with  improper specimen collection/handling, submission of specimen other  than nasopharyngeal swab, presence of viral mutation(s) within the areas targeted by this assay, and inadequate number of viral copies(<138 copies/mL). A negative result must be combined with clinical observations, patient history, and epidemiological information. The expected result is Negative.  Fact Sheet for Patients:  EntrepreneurPulse.com.au  Fact Sheet for Healthcare Providers:  IncredibleEmployment.be  This test is no t yet approved or cleared by the [place] FDA and  has been authorized for detection and/or diagnosis of SARS-CoV-2 by FDA under an Emergency Use Authorization (EUA). This EUA will remain  in effect (meaning this test can be used) for the duration of the COVID-19 declaration under Section 564(b)(1) of the Act, 21 U.S.C.section 360bbb-3(b)(1), unless the authorization is terminated  or revoked sooner.       Influenza A by PCR NEGATIVE NEGATIVE   Influenza B by PCR NEGATIVE NEGATIVE    Comment: (NOTE) The Xpert Xpress SARS-CoV-2/FLU/RSV plus assay is intended as an aid in the diagnosis of influenza from Nasopharyngeal swab specimens and should not be used as a sole basis for treatment. Nasal washings and aspirates are unacceptable for Xpert Xpress SARS-CoV-2/FLU/RSV testing.  Fact Sheet for Patients: EntrepreneurPulse.com.au  Fact Sheet for Healthcare Providers: IncredibleEmployment.be  This test is not yet approved or cleared by the [place] FDA and has been authorized for detection and/or diagnosis of SARS-CoV-2 by FDA under an Emergency Use Authorization (EUA). This EUA will remain in effect (meaning this test can be used) for the duration of the COVID-19 declaration under Section 564(b)(1) of the Act, 21 U.S.C. section 360bbb-3(b)(1), unless the authorization is terminated  or revoked.  Performed at [facility], [address] [place], [place] [address]    CT HEAD WO CONTRAST  Result Date: 11/24/2020 CLINICAL DATA:  Neuro deficit, acute stroke suspected. EXAM: CT HEAD WITHOUT CONTRAST TECHNIQUE: Contiguous axial images were obtained from the base of the skull through the vertex without intravenous contrast. COMPARISON:  None. FINDINGS: Brain: Large remote right MCA territory infarct with encephalomalacia and gliosis involving the right frontal, parietal and anterior temporal lobes. Ex vacuo ventricular dilation of the right lateral ventricle. No specific evidence of acute large vascular territory infarct. Scattered additional white matter hypoattenuation, compatible with chronic microvascular ischemic disease. Generalized cerebral atrophy. No acute hemorrhage. No abnormal mass effect. No evidence of extra-axial fluid collection. No mass lesion. Vascular: Calcific atherosclerosis. Skull: Acute fracture. Sinuses/Orbits: Visualized sinuses are clear.  Unremarkable orbits. Other: No mastoid effusions. IMPRESSION: 1. No evidence of acute intracranial abnormality. 2. Large remote right MCA territory infarct. 3. 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 Electronically Signed   By: Margaretha Sheffield MD   On: 11/24/2020 12:03   DG Chest Portable 1 View  Result Date: 11/24/2020 CLINICAL DATA:  Weakness, confusion and falling. EXAM: PORTABLE CHEST 1 VIEW COMPARISON:  None. FINDINGS: Poor inspiration. Heart size upper limits of normal. Aortic atherosclerotic calcification. The lungs are clear. No effusions. No acute bone finding. IMPRESSION: No active disease. Poor inspiration. Aortic atherosclerotic calcification. Electronically Signed   By: Nelson Chimes M.D.   On: 11/24/2020 13:32    Review of Systems  Constitutional: Positive for activity change and fatigue. Negative for appetite change, chills and fever.  HENT: Negative for mouth sores,  nosebleeds and rhinorrhea.   Eyes: Negative for pain and discharge.  Respiratory: Positive for cough. Negative for choking, chest tightness and shortness of breath.   Cardiovascular: Negative for chest pain, palpitations and leg swelling.  Gastrointestinal: Negative for abdominal pain, diarrhea,  nausea and vomiting.  Genitourinary: Negative for difficulty urinating and frequency.  Musculoskeletal: Negative for back pain and joint swelling.  Skin: Negative for rash and wound.  Neurological: Positive for speech difficulty and weakness. Negative for dizziness, numbness and headaches.  Psychiatric/Behavioral: Positive for confusion. Negative for agitation and hallucinations.    Blood pressure 135/61, pulse 69, temperature 97.7 F (36.5 C), temperature source Oral, resp. rate 18, height 5\' 6"  (1.676 m), weight 77.1 kg, SpO2 99 %. Physical Exam Constitutional:      General: He is not in acute distress.    Appearance: He is normal weight. He is not ill-appearing, toxic-appearing or diaphoretic.  HENT:     Head: Normocephalic and atraumatic.     Nose: Nose normal. No congestion.     Mouth/Throat:     Mouth: Mucous membranes are moist.     Pharynx: Oropharynx is clear.  Eyes:     General: No scleral icterus.    Extraocular Movements: Extraocular movements intact.     Conjunctiva/sclera: Conjunctivae normal.     Pupils: Pupils are equal, round, and reactive to light.  Cardiovascular:     Rate and Rhythm: Normal rate and regular rhythm.     Heart sounds: No murmur heard. No gallop.   Pulmonary:     Effort: Pulmonary effort is normal. No respiratory distress.     Breath sounds: Normal breath sounds. No wheezing.  Abdominal:     General: Abdomen is flat. Bowel sounds are normal. There is no distension.     Palpations: Abdomen is soft.     Tenderness: There is no abdominal tenderness.  Musculoskeletal:        General: No swelling or tenderness.     Cervical back: Normal range of motion  and neck supple. No rigidity.     Right lower leg: No edema.     Left lower leg: No edema.  Lymphadenopathy:     Cervical: No cervical adenopathy.  Skin:    General: Skin is warm and dry.  Neurological:     General: No focal deficit present.     Mental Status: He is alert and oriented to person, place, and time.     Cranial Nerves: No cranial nerve deficit.  Psychiatric:        Mood and Affect: Mood normal.        Behavior: Behavior normal.      Assessment/Plan #1.  Acute metabolic encephalopathy. Patient had some confusion, weakness, and slurred speech.  Slurred speech resolved in 10 minutes.  Patient has a history of stroke, CT head did not show any acute changes. We will obtain MRI to rule out recurrent stroke. Patient most likely secondary to acute kidney failure and urinary retention.  Need to rule out urinary tract infection.  2.  Acute kidney failure secondary to urinary retention.  Likely due to benign prostate hypertrophy.  Need to rule out UTI. Foley catheter will be anchored.  Continue fluid resuscitation.  Recheck a BMP tomorrow. I will arbitrarily cover with Rocephin for possible UTI.  3.  Essential hypertension. Hold off lisinopril for acute kidney injury.  4.  Type 2 diabetes controlled. Continue sliding scale insulin.  Hold off oral diabetic medicines.  , MD 11/24/2020, 4:49 PM

## 2020-11-24 NOTE — Plan of Care (Signed)
Care plan initiated.

## 2020-11-25 DIAGNOSIS — R338 Other retention of urine: Secondary | ICD-10-CM | POA: Diagnosis not present

## 2020-11-25 DIAGNOSIS — G9341 Metabolic encephalopathy: Secondary | ICD-10-CM | POA: Diagnosis not present

## 2020-11-25 DIAGNOSIS — N179 Acute kidney failure, unspecified: Secondary | ICD-10-CM | POA: Diagnosis not present

## 2020-11-25 LAB — BASIC METABOLIC PANEL
Anion gap: 8 (ref 5–15)
BUN: 21 mg/dL (ref 8–23)
CO2: 24 mmol/L (ref 22–32)
Calcium: 8.7 mg/dL — ABNORMAL LOW (ref 8.9–10.3)
Chloride: 110 mmol/L (ref 98–111)
Creatinine, Ser: 1.45 mg/dL — ABNORMAL HIGH (ref 0.61–1.24)
GFR, Estimated: 48 mL/min — ABNORMAL LOW (ref >60)
Glucose, Bld: 87 mg/dL (ref 70–99)
Potassium: 4.1 mmol/L (ref 3.5–5.1)
Sodium: 142 mmol/L (ref 135–145)

## 2020-11-25 LAB — CBC
HCT: 34 % — ABNORMAL LOW (ref 39.0–52.0)
Hemoglobin: 11.1 g/dL — ABNORMAL LOW (ref 13.0–17.0)
MCH: 28.7 pg (ref 26.0–34.0)
MCHC: 32.6 g/dL (ref 30.0–36.0)
MCV: 87.9 fL (ref 80.0–100.0)
Platelets: 187 10*3/uL (ref 150–400)
RBC: 3.87 MIL/uL — ABNORMAL LOW (ref 4.22–5.81)
RDW: 13.7 % (ref 11.5–15.5)
WBC: 9.5 10*3/uL (ref 4.0–10.5)
nRBC: 0 % (ref 0.0–0.2)

## 2020-11-25 LAB — GLUCOSE, CAPILLARY
Glucose-Capillary: 109 mg/dL — ABNORMAL HIGH (ref 70–99)
Glucose-Capillary: 171 mg/dL — ABNORMAL HIGH (ref 70–99)

## 2020-11-25 LAB — MAGNESIUM: Magnesium: 2.2 mg/dL (ref 1.7–2.4)

## 2020-11-25 MED ORDER — TAMSULOSIN HCL 0.4 MG PO CAPS
0.4000 mg | ORAL_CAPSULE | Freq: Once | ORAL | Status: AC
  Administered 2020-11-25: 12:00:00 0.4 mg via ORAL
  Filled 2020-11-25: qty 1

## 2020-11-25 MED ORDER — TAMSULOSIN HCL 0.4 MG PO CAPS: 0.4000 mg | ORAL_CAPSULE | Freq: Every day | ORAL | 0 refills | Status: DC

## 2020-11-25 NOTE — Discharge Summary (Addendum)
Physician Discharge Summary  Patient ID: Eric Lopez MRN: [id number] DOB/AGE: August 07, 1941 79 y.o.  Admit date: 11/24/2020 Discharge date: 11/25/2020  Admission Diagnoses:  Discharge Diagnoses:  Active Problems:   AKI (acute kidney injury) [facility])   Discharged Condition: good  Hospital Course:  Eric Lopez is a 79 year old male with history of CVA, type 2 diabetes, dyslipidemia, hypothyroidism, iron deficient anemia, who present to the hospital with altered mental status.  He was a found to have acute kidney injury, also had urinary retention 710 mL.  Foley cath will be anchored.  Patient be admitted to hospital for observation.  Patient had acute kidney injury due to urinary retention.  Foley catheter will be continued, Flomax will be started.  Patient was also giving IV fluids, renal function has improved.  We will schedule follow-up with urology as outpatient. Patient also had MRI of the brain due to slurred speech and weakness.  Identified old infarct, without acute changes. At this point, patient is medically stable to be discharged.  #1.  Acute metabolic encephalopathy. Patient had some confusion, weakness, and slurred speech.  Slurred speech resolved in 10 minutes.  Patient has a history of stroke, CT head did not show any acute changes. MRI of the brain did not show acute changes.  Urine test does not indicate UTI.  Urine culture was sent out, result is irrelevant.  2.  Acute kidney failure due to obstruction.  Likely due to benign prostate hypertrophy.    UA negative, Foley catheter is anchored.    Be followed by urology as outpatient.  3.  Essential hypertension. Hold off lisinopril for acute kidney injury.  4.  Type 2 diabetes controlled. For the Metformin for acute kidney injury.  May restart at the next office visit with PCP    Consults: None  Significant Diagnostic Studies:   MRI HEAD WITHOUT CONTRAST  TECHNIQUE: Multiplanar, multiecho pulse sequences  of the brain and surrounding structures were obtained without intravenous contrast.  COMPARISON:  None.  FINDINGS: Brain: No acute infarct, mass effect or extra-axial collection. No acute hemorrhage. Hemosiderin deposition adjacent to large area of encephalomalacia at the site of chronic right MCA territory infarct. Confluent hyperintense T2-weighted white matter signal. Generalized volume loss without a clear lobar predilection. The midline structures are normal.  Vascular: Major flow voids are preserved.  Skull and upper cervical spine: Normal calvarium and skull base. Visualized upper cervical spine and soft tissues are normal.  Sinuses/Orbits:No paranasal sinus fluid levels or advanced mucosal thickening. No mastoid or middle ear effusion. Normal orbits.  IMPRESSION: 1. No acute intracranial abnormality. 2. Chronic right MCA territory infarct and findings of chronic small vessel disease.   Electronically Signed   By: Ulyses Jarred M.D.   On: 11/24/2020 21:16  Treatments: Foley, IVF  Discharge Exam: Blood pressure (!) 178/101, pulse (!) 51, temperature 99.1 F (37.3 C), temperature source Oral, resp. rate 16, height 5\' 6"  (1.676 m), weight 77.1 kg, SpO2 96 %. General appearance: alert, cooperative and Oriented to time place and person. Resp: clear to auscultation bilaterally Cardio: regular rate and rhythm, S1, S2 normal, no murmur, click, rub or gallop GI: soft, non-tender; bowel sounds normal; no masses,  no organomegaly Extremities: extremities normal, atraumatic, no cyanosis or edema  Disposition: Discharge disposition: 01-Home or Self Care       Discharge Instructions    Diet - low sodium heart healthy   Complete by: As directed    Increase activity slowly   Complete by: As  directed      Allergies as of 11/25/2020      Reactions   Sulfa Antibiotics Itching, Rash      Medication List    STOP taking these medications   lisinopril 10 MG  tablet Commonly known as: ZESTRIL   metFORMIN 500 MG tablet Commonly known as: GLUCOPHAGE     TAKE these medications   aspirin EC 81 MG tablet Take by mouth.   baclofen 20 MG tablet Commonly known as: LIORESAL Take by mouth.   citalopram 20 MG tablet Commonly known as: CELEXA Take by mouth.   gabapentin 300 MG capsule Commonly known as: NEURONTIN Take by mouth.   glimepiride 2 MG tablet Commonly known as: AMARYL Take by mouth.   levothyroxine 100 MCG tablet Commonly known as: SYNTHROID Take by mouth.   Melatonin Maximum Strength 5 MG Tabs Generic drug: melatonin Take by mouth.   omeprazole 40 MG capsule Commonly known as: PRILOSEC TAKE 1 CAPSULE (40 MG TOTAL) BY MOUTH DAILY.   RA Vitamin B-12 TR 1000 MCG Tbcr Generic drug: Cyanocobalamin Take by mouth.   simvastatin 40 MG tablet Commonly known as: ZOCOR Take by mouth.   tamsulosin 0.4 MG Caps capsule Commonly known as: FLOMAX Take 1 capsule (0.4 mg total) by mouth daily after supper.       Follow-up Information    Danella Penton, MD Follow up in 1 week(s).   Specialty: Internal Medicine Contact information: [phone] [facility] [address] [facility] [place] [address] [phone]        Riki Altes, MD Follow up in 2 week(s).   Specialty: Urology Contact information: [address] [place] [address] [phone]              35 minutes. Signed: Marrion Coy 11/25/2020, 9:17 AM

## 2020-11-25 NOTE — Progress Notes (Signed)
Pt left with foley connected to leg bag. Instructions were given to pt and wife. Pt left with walker provided after PT eval was done.

## 2020-11-25 NOTE — Evaluation (Signed)
Physical Therapy Evaluation Patient Details Name: Eric Lopez MRN: [id number] DOB: March 12, 1941 Today's Date: 11/25/2020   History of Present Illness  Eric Lopez is a 79 year old male with history of CVA, type 2 diabetes, dyslipidemia, hypothyroidism, iron deficient anemia, who present to the hospital with altered mental status.  He was a found to have acute kidney injury, also had urinary retention 710 mL.  Foley cath will be anchored.  Patient be admitted to hospital for observation.  Patient had acute kidney injury due to urinary retention.  Clinical Impression  The pt presents today with wife stating that the pt has not ambulated x2 days. He is initially slightly lethargic but has increased arousal once sitting upright. The pt does require use of RW for safety this session d/t mild balance deficits. He is able to tolerate ~200' ambulating with AD, and reports no increased pain or fatigue. The pt is expected to be able to safely d/c home with 24/7 supervision, HHPT, and use of RW at all times. PT will continue to follow while in house in order to optimize functional mobility.      Follow Up Recommendations Home health PT;Supervision/Assistance - 24 hour    Equipment Recommendations  Rolling walker with 5" wheels    Recommendations for Other Services       Precautions / Restrictions Precautions Precautions: Fall Restrictions Weight Bearing Restrictions: No      Mobility  Bed Mobility Overal bed mobility: Needs Assistance Bed Mobility: Supine to Sit     Supine to sit: Min assist     General bed mobility comments: HOB slightly elevated. Verbal cues to achieve foot flat contact.    Transfers Overall transfer level: Needs assistance Equipment used: Rolling walker (2 wheeled) Transfers: Sit to/from Stand Sit to Stand: Min assist            Ambulation/Gait Ambulation/Gait assistance: Min assist Gait Distance (Feet): 200 Feet Assistive device: Rolling walker (2  wheeled) Gait Pattern/deviations: Shuffle;Decreased stride length     General Gait Details: Pt receiving verbal cues for increased step length in order to increase safety with gait.  Stairs            Wheelchair Mobility    Modified Rankin (Stroke Patients Only)       Balance Overall balance assessment: Mild deficits observed, not formally tested                                           Pertinent Vitals/Pain Pain Assessment: No/denies pain    Home Living Family/patient expects to be discharged to:: Private residence Living Arrangements: Spouse/significant other Available Help at Discharge: Family Type of Home: House Home Access: Stairs to enter Entrance Stairs-Rails: Right Entrance Stairs-Number of Steps: 3 Home Layout: One level;Laundry or work area in basement;Able to live on main level with bedroom/bathroom Home Equipment: Environmental consultant - 4 wheels      Prior Function Level of Independence: Independent               Hand Dominance   Dominant Hand: Right    Extremity/Trunk Assessment   Upper Extremity Assessment Upper Extremity Assessment: Overall WFL for tasks assessed    Lower Extremity Assessment Lower Extremity Assessment: Overall WFL for tasks assessed    Cervical / Trunk Assessment Cervical / Trunk Assessment: Normal  Communication   Communication: HOH  Cognition Arousal/Alertness: Awake/alert Behavior During Therapy: [facility]  for tasks assessed/performed Overall Cognitive Status: Impaired/Different from baseline Area of Impairment: Problem solving                             Problem Solving: Slow processing        General Comments      Exercises     Assessment/Plan    PT Assessment Patient needs continued PT services  PT Problem List Decreased mobility;Decreased coordination;Decreased balance       PT Treatment Interventions Gait training;Therapeutic activities;Therapeutic exercise;Balance  training;Stair training;Functional mobility training    PT Goals (Current goals can be found in the Care Plan section)  Acute Rehab PT Goals Patient Stated Goal: to go home today PT Goal Formulation: With patient/family Time For Goal Achievement: 12/09/20 Potential to Achieve Goals: Good    Frequency Min 2X/week   Barriers to discharge        Co-evaluation               AM-PAC PT "6 Clicks" Mobility  Outcome Measure Help needed turning from your back to your side while in a flat bed without using bedrails?: A Little Help needed moving from lying on your back to sitting on the side of a flat bed without using bedrails?: A Little Help needed moving to and from a bed to a chair (including a wheelchair)?: A Little Help needed standing up from a chair using your arms (e.g., wheelchair or bedside chair)?: A Little Help needed to walk in hospital room?: A Little Help needed climbing 3-5 steps with a railing? : A Little 6 Click Score: 18    End of Session Equipment Utilized During Treatment: Gait belt Activity Tolerance: Patient tolerated treatment well;No increased pain Patient left: in bed;with family/visitor present;with call bell/phone within reach (sitting at EOB in order to eat lunch.) Nurse Communication:  (Spoke with care team regarding d/c recommendations and pt needs.) PT Visit Diagnosis: Unsteadiness on feet (R26.81);Other abnormalities of gait and mobility (R26.89)    Time: [id number] PT Time Calculation (min) (ACUTE ONLY): 32 min   Charges:   PT Evaluation $PT Eval Low Complexity: 1 Low PT Treatments $Gait Training: 8-22 mins        11:49 AM, 11/25/20 Pammie Chirino A. Saverio Danker PT, DPT Physical Therapist - [facility]   Kien Mirsky A Shai Rasmussen 11/25/2020, 11:41 AM

## 2020-11-25 NOTE — TOC Initial Note (Addendum)
Transition of Care [facility]) - Initial/Assessment Note    Patient Details  Name: Eric Lopez MRN: [id number] Date of Birth: 03-03-1941  Transition of Care [facility]) CM/SW Contact:    Eric Cline, LCSW Phone Number: 11/25/2020, 9:55 AM  Clinical Narrative:         Patient has orders to DC home today. Spoke with patient's wife. She reported patient lives with her and she transports him to appointments. Confirmed home address in chart. PCP is Dr. Hyacinth Lopez. Pharmacy is [facility]. No DME but patient does have a built in shower seat. No SNF history. Patient had HH 20+ years ago. CSW explained MD ordered HHPT, RN, and Aide. Patient and wife agreeable to this. Referral made to Eric Lopez who accepted patient. No other needs identified prior to DC.    12:45- CSW informed that patient needs a RW and is agreeable to CSW ordering one per PT. [facility] is out of RW. Called [facility], they have RW and can deliver to bedside prior to DC. Eric Lopez with [facility] is aware plan for DC today.        Expected Discharge Plan: Home w Home Health Services Barriers to Discharge: Barriers Resolved   Patient Goals and CMS Choice Patient states their goals for this hospitalization and ongoing recovery are:: home with home health CMS Medicare.gov Compare Post Acute Care list provided to:: Patient Represenative (must comment) Choice offered to / list presented to : [facility]  Expected Discharge Plan and Services Expected Discharge Plan: Home w Home Health Services       Living arrangements for the past 2 months: Single Family Home Expected Discharge Date: 11/25/20                         [facility] Arranged: PT,RN,Nurse's Aide [facility] Agency: [facility] Date HH Agency Contacted: 11/25/20   Representative spoke with at [facility] Agency: Eric Lopez  Prior Living Arrangements/Services Living arrangements for the past 2 months: Single Family Home Lives  with:: Spouse Patient language and need for interpreter reviewed:: Yes Do you feel safe going back to the place where you live?: Yes      Need for Family Participation in Patient Care: Yes (Comment) Care giver support system in place?: Yes (comment)   Criminal Activity/Legal Involvement Pertinent to Current Situation/Hospitalization: No - Comment as needed  Activities of Daily Living Home Assistive Devices/Equipment: None ADL Screening (condition at time of admission) Patient's cognitive ability adequate to safely complete daily activities?: No Is the patient deaf or have difficulty hearing?: No Does the patient have difficulty seeing, even when wearing glasses/contacts?: No Does the patient have difficulty concentrating, remembering, or making decisions?: Yes Patient able to express need for assistance with ADLs?: Yes Does the patient have difficulty dressing or bathing?: No Independently performs ADLs?: Yes (appropriate for developmental age) (with 1 assist) Does the patient have difficulty walking or climbing stairs?: Yes Weakness of Legs: Both Weakness of Arms/Hands: None  Permission Sought/Granted Permission sought to share information with : Facility Industrial/product designer granted to share information with : Yes, Verbal Permission Granted     Permission granted to share info w AGENCY: Home Health agencies        Emotional Assessment         Alcohol / Substance Use: Not Applicable Psych Involvement: No (comment)  Admission diagnosis:  Confusion [R41.0] AKI (acute kidney injury) (HCC) [N17.9] Fall  in home, initial encounter [W19.XXXA, Y92.009] Type 2 diabetes mellitus without complication, without long-term current use of insulin (HCC) [E11.9] Patient Active Problem List   Diagnosis Date Noted  . AKI (acute kidney injury) (HCC) 11/24/2020  . IDA (iron deficiency anemia) 04/04/2015   PCP:  Eric Penton, MD [phone] [place],  [place] - [address] [place] [address] Phone: [phone] Fax: [phone]     Social Determinants of Health (SDOH) Interventions    Readmission Risk Interventions No flowsheet data found.

## 2020-11-27 DIAGNOSIS — E119 Type 2 diabetes mellitus without complications: Secondary | ICD-10-CM | POA: Diagnosis not present

## 2020-11-27 DIAGNOSIS — E039 Hypothyroidism, unspecified: Secondary | ICD-10-CM | POA: Diagnosis not present

## 2020-11-27 DIAGNOSIS — D509 Iron deficiency anemia, unspecified: Secondary | ICD-10-CM | POA: Diagnosis not present

## 2020-11-27 DIAGNOSIS — I10 Essential (primary) hypertension: Secondary | ICD-10-CM | POA: Diagnosis not present

## 2020-11-27 DIAGNOSIS — Z466 Encounter for fitting and adjustment of urinary device: Secondary | ICD-10-CM | POA: Diagnosis not present

## 2020-11-27 DIAGNOSIS — Z7984 Long term (current) use of oral hypoglycemic drugs: Secondary | ICD-10-CM | POA: Diagnosis not present

## 2020-11-27 DIAGNOSIS — E785 Hyperlipidemia, unspecified: Secondary | ICD-10-CM | POA: Diagnosis not present

## 2020-11-27 DIAGNOSIS — G9341 Metabolic encephalopathy: Secondary | ICD-10-CM | POA: Diagnosis not present

## 2020-11-27 DIAGNOSIS — R339 Retention of urine, unspecified: Secondary | ICD-10-CM | POA: Diagnosis not present

## 2020-11-27 LAB — URINE CULTURE: Culture: 30000 — AB

## 2020-11-28 DIAGNOSIS — Z466 Encounter for fitting and adjustment of urinary device: Secondary | ICD-10-CM | POA: Diagnosis not present

## 2020-11-28 DIAGNOSIS — I10 Essential (primary) hypertension: Secondary | ICD-10-CM | POA: Diagnosis not present

## 2020-11-28 DIAGNOSIS — G9341 Metabolic encephalopathy: Secondary | ICD-10-CM | POA: Diagnosis not present

## 2020-11-28 DIAGNOSIS — E119 Type 2 diabetes mellitus without complications: Secondary | ICD-10-CM | POA: Diagnosis not present

## 2020-11-28 DIAGNOSIS — Z7984 Long term (current) use of oral hypoglycemic drugs: Secondary | ICD-10-CM | POA: Diagnosis not present

## 2020-11-28 DIAGNOSIS — E785 Hyperlipidemia, unspecified: Secondary | ICD-10-CM | POA: Diagnosis not present

## 2020-11-28 DIAGNOSIS — D509 Iron deficiency anemia, unspecified: Secondary | ICD-10-CM | POA: Diagnosis not present

## 2020-11-28 DIAGNOSIS — E039 Hypothyroidism, unspecified: Secondary | ICD-10-CM | POA: Diagnosis not present

## 2020-11-28 DIAGNOSIS — R339 Retention of urine, unspecified: Secondary | ICD-10-CM | POA: Diagnosis not present

## 2020-12-01 DIAGNOSIS — G9341 Metabolic encephalopathy: Secondary | ICD-10-CM | POA: Diagnosis not present

## 2020-12-01 DIAGNOSIS — I10 Essential (primary) hypertension: Secondary | ICD-10-CM | POA: Diagnosis not present

## 2020-12-01 DIAGNOSIS — E039 Hypothyroidism, unspecified: Secondary | ICD-10-CM | POA: Diagnosis not present

## 2020-12-01 DIAGNOSIS — Z466 Encounter for fitting and adjustment of urinary device: Secondary | ICD-10-CM | POA: Diagnosis not present

## 2020-12-01 DIAGNOSIS — E785 Hyperlipidemia, unspecified: Secondary | ICD-10-CM | POA: Diagnosis not present

## 2020-12-01 DIAGNOSIS — R339 Retention of urine, unspecified: Secondary | ICD-10-CM | POA: Diagnosis not present

## 2020-12-01 DIAGNOSIS — E119 Type 2 diabetes mellitus without complications: Secondary | ICD-10-CM | POA: Diagnosis not present

## 2020-12-02 DIAGNOSIS — Z466 Encounter for fitting and adjustment of urinary device: Secondary | ICD-10-CM | POA: Diagnosis not present

## 2020-12-02 DIAGNOSIS — Z7984 Long term (current) use of oral hypoglycemic drugs: Secondary | ICD-10-CM | POA: Diagnosis not present

## 2020-12-02 DIAGNOSIS — R339 Retention of urine, unspecified: Secondary | ICD-10-CM | POA: Diagnosis not present

## 2020-12-02 DIAGNOSIS — D509 Iron deficiency anemia, unspecified: Secondary | ICD-10-CM | POA: Diagnosis not present

## 2020-12-02 DIAGNOSIS — I10 Essential (primary) hypertension: Secondary | ICD-10-CM | POA: Diagnosis not present

## 2020-12-02 DIAGNOSIS — E785 Hyperlipidemia, unspecified: Secondary | ICD-10-CM | POA: Diagnosis not present

## 2020-12-02 DIAGNOSIS — E119 Type 2 diabetes mellitus without complications: Secondary | ICD-10-CM | POA: Diagnosis not present

## 2020-12-02 DIAGNOSIS — E039 Hypothyroidism, unspecified: Secondary | ICD-10-CM | POA: Diagnosis not present

## 2020-12-02 DIAGNOSIS — G9341 Metabolic encephalopathy: Secondary | ICD-10-CM | POA: Diagnosis not present

## 2020-12-03 DIAGNOSIS — E538 Deficiency of other specified B group vitamins: Secondary | ICD-10-CM | POA: Diagnosis not present

## 2020-12-03 DIAGNOSIS — D500 Iron deficiency anemia secondary to blood loss (chronic): Secondary | ICD-10-CM | POA: Diagnosis not present

## 2020-12-03 DIAGNOSIS — N179 Acute kidney failure, unspecified: Secondary | ICD-10-CM | POA: Diagnosis not present

## 2020-12-04 DIAGNOSIS — E039 Hypothyroidism, unspecified: Secondary | ICD-10-CM | POA: Diagnosis not present

## 2020-12-04 DIAGNOSIS — G9341 Metabolic encephalopathy: Secondary | ICD-10-CM | POA: Diagnosis not present

## 2020-12-04 DIAGNOSIS — Z7984 Long term (current) use of oral hypoglycemic drugs: Secondary | ICD-10-CM | POA: Diagnosis not present

## 2020-12-04 DIAGNOSIS — R339 Retention of urine, unspecified: Secondary | ICD-10-CM | POA: Diagnosis not present

## 2020-12-04 DIAGNOSIS — I10 Essential (primary) hypertension: Secondary | ICD-10-CM | POA: Diagnosis not present

## 2020-12-04 DIAGNOSIS — Z466 Encounter for fitting and adjustment of urinary device: Secondary | ICD-10-CM | POA: Diagnosis not present

## 2020-12-04 DIAGNOSIS — D509 Iron deficiency anemia, unspecified: Secondary | ICD-10-CM | POA: Diagnosis not present

## 2020-12-04 DIAGNOSIS — E119 Type 2 diabetes mellitus without complications: Secondary | ICD-10-CM | POA: Diagnosis not present

## 2020-12-04 DIAGNOSIS — E785 Hyperlipidemia, unspecified: Secondary | ICD-10-CM | POA: Diagnosis not present

## 2020-12-05 NOTE — Progress Notes (Signed)
[facility]  Physical Therapy Certification  Patient Details  Name: SHIVAAY STORMONT MRN: [id number] Date of Birth: 12-Aug-1941 Medical Diagnosis: Active Problems:   AKI (acute kidney injury) ([place])  Visit Diagnosis: PT Visit Diagnosis: Unsteadiness on feet (R26.81),Other abnormalities of gait and mobility (R26.89) PT Problem: Decreased mobility,Decreased coordination,Decreased balance  Goals:    Duration: Services will be provided through the following date: 12/09/20  Frequency: Min 2X/week  Amount: one treatment session per day unless otherwise indicated.    Certification Start Date: 39/53/2023 Certification End Date: 12/09/20   PT Treatments/Interventions: Gait training,Therapeutic activities,Therapeutic exercise,Balance training,Stair training,Functional mobility training   Dustina Scoggin H. Owens Shark, PT, DPT, NCS 12/05/20, 8:57 AM [phone]

## 2020-12-08 DIAGNOSIS — E785 Hyperlipidemia, unspecified: Secondary | ICD-10-CM | POA: Diagnosis not present

## 2020-12-08 DIAGNOSIS — Z466 Encounter for fitting and adjustment of urinary device: Secondary | ICD-10-CM | POA: Diagnosis not present

## 2020-12-08 DIAGNOSIS — Z7984 Long term (current) use of oral hypoglycemic drugs: Secondary | ICD-10-CM | POA: Diagnosis not present

## 2020-12-08 DIAGNOSIS — R339 Retention of urine, unspecified: Secondary | ICD-10-CM | POA: Diagnosis not present

## 2020-12-08 DIAGNOSIS — I10 Essential (primary) hypertension: Secondary | ICD-10-CM | POA: Diagnosis not present

## 2020-12-08 DIAGNOSIS — G9341 Metabolic encephalopathy: Secondary | ICD-10-CM | POA: Diagnosis not present

## 2020-12-08 DIAGNOSIS — E039 Hypothyroidism, unspecified: Secondary | ICD-10-CM | POA: Diagnosis not present

## 2020-12-08 DIAGNOSIS — D509 Iron deficiency anemia, unspecified: Secondary | ICD-10-CM | POA: Diagnosis not present

## 2020-12-08 DIAGNOSIS — E119 Type 2 diabetes mellitus without complications: Secondary | ICD-10-CM | POA: Diagnosis not present

## 2020-12-09 DIAGNOSIS — E785 Hyperlipidemia, unspecified: Secondary | ICD-10-CM | POA: Diagnosis not present

## 2020-12-09 DIAGNOSIS — Z466 Encounter for fitting and adjustment of urinary device: Secondary | ICD-10-CM | POA: Diagnosis not present

## 2020-12-09 DIAGNOSIS — R339 Retention of urine, unspecified: Secondary | ICD-10-CM | POA: Diagnosis not present

## 2020-12-09 DIAGNOSIS — I10 Essential (primary) hypertension: Secondary | ICD-10-CM | POA: Diagnosis not present

## 2020-12-09 DIAGNOSIS — G9341 Metabolic encephalopathy: Secondary | ICD-10-CM | POA: Diagnosis not present

## 2020-12-09 DIAGNOSIS — Z7984 Long term (current) use of oral hypoglycemic drugs: Secondary | ICD-10-CM | POA: Diagnosis not present

## 2020-12-09 DIAGNOSIS — E119 Type 2 diabetes mellitus without complications: Secondary | ICD-10-CM | POA: Diagnosis not present

## 2020-12-09 DIAGNOSIS — D509 Iron deficiency anemia, unspecified: Secondary | ICD-10-CM | POA: Diagnosis not present

## 2020-12-09 DIAGNOSIS — E039 Hypothyroidism, unspecified: Secondary | ICD-10-CM | POA: Diagnosis not present

## 2020-12-10 ENCOUNTER — Ambulatory Visit: Payer: Self-pay | Admitting: Urology

## 2020-12-11 ENCOUNTER — Ambulatory Visit (INDEPENDENT_AMBULATORY_CARE_PROVIDER_SITE_OTHER): Payer: Medicare HMO | Admitting: Urology

## 2020-12-11 ENCOUNTER — Other Ambulatory Visit: Payer: Self-pay

## 2020-12-11 ENCOUNTER — Ambulatory Visit (INDEPENDENT_AMBULATORY_CARE_PROVIDER_SITE_OTHER): Payer: Medicare HMO | Admitting: Physician Assistant

## 2020-12-11 ENCOUNTER — Other Ambulatory Visit: Payer: Self-pay | Admitting: Physician Assistant

## 2020-12-11 ENCOUNTER — Encounter: Payer: Self-pay | Admitting: Urology

## 2020-12-11 DIAGNOSIS — R339 Retention of urine, unspecified: Secondary | ICD-10-CM

## 2020-12-11 LAB — BLADDER SCAN AMB NON-IMAGING: Scan Result: 5 mL

## 2020-12-11 MED ORDER — LEVOFLOXACIN 500 MG PO TABS
500.0000 mg | ORAL_TABLET | Freq: Once | ORAL | Status: AC
  Administered 2020-12-11: 500 mg via ORAL

## 2020-12-11 MED ORDER — TAMSULOSIN HCL 0.4 MG PO CAPS
0.4000 mg | ORAL_CAPSULE | Freq: Every day | ORAL | 2 refills | Status: AC
Start: 2020-12-11 — End: ?

## 2020-12-11 NOTE — Progress Notes (Signed)
Afternoon follow-up  Patient returned to clinic this afternoon for repeat PVR. He reports drinking approximately 2 glasses of water. He has been able to urinate. PVR 22mL.  Results for orders placed or performed in visit on 12/11/20  BLADDER SCAN AMB NON-IMAGING  Result Value Ref Range   Scan Result 5 mL    Voiding trial passed. Patient reports tolerating Flomax well without orthostasis. Refilling today.  Follow up: Return in about 4 weeks (around 01/08/2021) for Repeat PVR, DRE, IPSS with Dr. Bernardo Heater.

## 2020-12-11 NOTE — Addendum Note (Signed)
Addended by: Mickle Plumb on: 12/11/2020 01:15 PM   Modules accepted: Orders

## 2020-12-11 NOTE — Progress Notes (Signed)
Catheter Removal  Patient is present today for a catheter removal.  9ml of water was drained from the balloon. A 14FR foley cath was removed from the bladder no complications were noted . Patient tolerated well.  Performed by: Bradly Bienenstock, CMA  Follow up/ Additional notes: RTC this afternoon for bladder scan.

## 2020-12-11 NOTE — Progress Notes (Signed)
12/11/2020 8:35 AM   Eric Lopez [birth year] [id number]  Referring provider: Rusty Aus, MD [facility] [place],  [place] [address]  Chief Complaint  Patient presents with  . Other    VOIDING TRIAL     HPI: Eric Lopez is an 80 y.o. male presents for hospital follow-up.   Admitted late December 2021 with acute kidney injury and altered mental status  Found to be in urinary retention with bladder volume 710 mL and placement of Foley catheter  No renal imaging was performed  Creatinine on admission was 2.5; recent creatinine Dr. Ammie Ferrier office was 1.6 with baseline creatinine ranging between 1.2-1.8  No bothersome LUTS prior to hospital admission  No previous history of urinary retention or prior urologic problems  Was started on tamsulosin while hospitalized   PMH: Past Medical History:  Diagnosis Date  . B12 deficiency   . CVA (cerebral infarction)    left hemiparesis  . Diabetes mellitus   . Hematuria   . Hyperlipidemia   . Hypothyroidism   . IDA (iron deficiency anemia) 04/04/2015  . Osteoarthritis   . Stroke ([place])   . Type 2 diabetes mellitus with renal manifestations, controlled ([place])   . Vitiligo     Surgical History: Past Surgical History:  Procedure Laterality Date  . CAROTID ENDARTERECTOMY    . CERVICAL SPINE SURGERY    . CHOLECYSTECTOMY    . COLONOSCOPY WITH PROPOFOL N/A 01/30/2016   Procedure: COLONOSCOPY WITH PROPOFOL;  Surgeon: Manya Silvas, MD;  Location: [facility] ENDOSCOPY;  Service: Endoscopy;  Laterality: N/A;  . LAMINOTOMY / EXCISION DISK POSTERIOR CERVICAL SPINE    . NOSE SURGERY      Home Medications:  Allergies as of 12/11/2020      Reactions   Sulfa Antibiotics Itching, Rash      Medication List       Accurate as of December 11, 2020  8:35 AM. If you have any questions, ask your nurse or doctor.        aspirin EC 81 MG tablet Take by mouth.   baclofen 20 MG  tablet Commonly known as: LIORESAL Take by mouth.   citalopram 20 MG tablet Commonly known as: CELEXA Take by mouth.   gabapentin 300 MG capsule Commonly known as: NEURONTIN Take by mouth.   glimepiride 2 MG tablet Commonly known as: AMARYL Take by mouth.   levothyroxine 100 MCG tablet Commonly known as: SYNTHROID Take by mouth.   Melatonin Maximum Strength 5 MG Tabs Generic drug: melatonin Take by mouth.   omeprazole 40 MG capsule Commonly known as: PRILOSEC TAKE 1 CAPSULE (40 MG TOTAL) BY MOUTH DAILY.   RA Vitamin B-12 TR 1000 MCG Tbcr Generic drug: Cyanocobalamin Take by mouth.   simvastatin 40 MG tablet Commonly known as: ZOCOR Take by mouth.   tamsulosin 0.4 MG Caps capsule Commonly known as: FLOMAX Take 1 capsule (0.4 mg total) by mouth daily after supper.       Allergies:  Allergies  Allergen Reactions  . Sulfa Antibiotics Itching and Rash    Family History: No family history on file.  Social History:  reports that he quit smoking about 21 years ago. His smoking use included pipe and cigars. He has a 35.00 pack-year smoking history. He has never used smokeless tobacco. He reports that he does not drink alcohol and does not use drugs.   Physical Exam: There were no vitals taken for this visit.  Constitutional:  Alert, No acute distress. HEENT: [place] AT, moist mucus membranes.  Trachea midline, no masses. Cardiovascular: No clubbing, cyanosis, or edema. Respiratory: Normal respiratory effort, no increased work of breathing. GI: Abdomen is soft, nontender, nondistended, no abdominal masses GU: No CVA tenderness   Assessment & Plan:    1.  Urinary retention  Catheter removed  Levaquin 500 mg x 1 dose given  He has follow-up scheduled this afternoon for bladder scan  If adequately emptying follow-up with me 1 month for repeat PVR, IPSS and DRE   Abbie Sons, MD  [place] [address], [place], [place] [address] [phone]

## 2020-12-11 NOTE — Progress Notes (Signed)
Error

## 2020-12-12 DIAGNOSIS — Z466 Encounter for fitting and adjustment of urinary device: Secondary | ICD-10-CM | POA: Diagnosis not present

## 2020-12-12 DIAGNOSIS — G9341 Metabolic encephalopathy: Secondary | ICD-10-CM | POA: Diagnosis not present

## 2020-12-12 DIAGNOSIS — Z7984 Long term (current) use of oral hypoglycemic drugs: Secondary | ICD-10-CM | POA: Diagnosis not present

## 2020-12-12 DIAGNOSIS — E119 Type 2 diabetes mellitus without complications: Secondary | ICD-10-CM | POA: Diagnosis not present

## 2020-12-12 DIAGNOSIS — E039 Hypothyroidism, unspecified: Secondary | ICD-10-CM | POA: Diagnosis not present

## 2020-12-12 DIAGNOSIS — R339 Retention of urine, unspecified: Secondary | ICD-10-CM | POA: Diagnosis not present

## 2020-12-12 DIAGNOSIS — E785 Hyperlipidemia, unspecified: Secondary | ICD-10-CM | POA: Diagnosis not present

## 2020-12-12 DIAGNOSIS — I10 Essential (primary) hypertension: Secondary | ICD-10-CM | POA: Diagnosis not present

## 2020-12-12 DIAGNOSIS — D509 Iron deficiency anemia, unspecified: Secondary | ICD-10-CM | POA: Diagnosis not present

## 2020-12-16 DIAGNOSIS — G9341 Metabolic encephalopathy: Secondary | ICD-10-CM | POA: Diagnosis not present

## 2020-12-16 DIAGNOSIS — E039 Hypothyroidism, unspecified: Secondary | ICD-10-CM | POA: Diagnosis not present

## 2020-12-16 DIAGNOSIS — R339 Retention of urine, unspecified: Secondary | ICD-10-CM | POA: Diagnosis not present

## 2020-12-16 DIAGNOSIS — Z466 Encounter for fitting and adjustment of urinary device: Secondary | ICD-10-CM | POA: Diagnosis not present

## 2020-12-16 DIAGNOSIS — E785 Hyperlipidemia, unspecified: Secondary | ICD-10-CM | POA: Diagnosis not present

## 2020-12-16 DIAGNOSIS — Z7984 Long term (current) use of oral hypoglycemic drugs: Secondary | ICD-10-CM | POA: Diagnosis not present

## 2020-12-16 DIAGNOSIS — D509 Iron deficiency anemia, unspecified: Secondary | ICD-10-CM | POA: Diagnosis not present

## 2020-12-16 DIAGNOSIS — I10 Essential (primary) hypertension: Secondary | ICD-10-CM | POA: Diagnosis not present

## 2020-12-16 DIAGNOSIS — E119 Type 2 diabetes mellitus without complications: Secondary | ICD-10-CM | POA: Diagnosis not present

## 2020-12-17 DIAGNOSIS — Z7984 Long term (current) use of oral hypoglycemic drugs: Secondary | ICD-10-CM | POA: Diagnosis not present

## 2020-12-17 DIAGNOSIS — Z466 Encounter for fitting and adjustment of urinary device: Secondary | ICD-10-CM | POA: Diagnosis not present

## 2020-12-17 DIAGNOSIS — E039 Hypothyroidism, unspecified: Secondary | ICD-10-CM | POA: Diagnosis not present

## 2020-12-17 DIAGNOSIS — D509 Iron deficiency anemia, unspecified: Secondary | ICD-10-CM | POA: Diagnosis not present

## 2020-12-17 DIAGNOSIS — G9341 Metabolic encephalopathy: Secondary | ICD-10-CM | POA: Diagnosis not present

## 2020-12-17 DIAGNOSIS — R339 Retention of urine, unspecified: Secondary | ICD-10-CM | POA: Diagnosis not present

## 2020-12-17 DIAGNOSIS — E785 Hyperlipidemia, unspecified: Secondary | ICD-10-CM | POA: Diagnosis not present

## 2020-12-17 DIAGNOSIS — E119 Type 2 diabetes mellitus without complications: Secondary | ICD-10-CM | POA: Diagnosis not present

## 2020-12-17 DIAGNOSIS — I10 Essential (primary) hypertension: Secondary | ICD-10-CM | POA: Diagnosis not present

## 2020-12-23 DIAGNOSIS — Z466 Encounter for fitting and adjustment of urinary device: Secondary | ICD-10-CM | POA: Diagnosis not present

## 2020-12-23 DIAGNOSIS — R339 Retention of urine, unspecified: Secondary | ICD-10-CM | POA: Diagnosis not present

## 2020-12-23 DIAGNOSIS — Z7984 Long term (current) use of oral hypoglycemic drugs: Secondary | ICD-10-CM | POA: Diagnosis not present

## 2020-12-23 DIAGNOSIS — G9341 Metabolic encephalopathy: Secondary | ICD-10-CM | POA: Diagnosis not present

## 2020-12-23 DIAGNOSIS — E119 Type 2 diabetes mellitus without complications: Secondary | ICD-10-CM | POA: Diagnosis not present

## 2020-12-23 DIAGNOSIS — E039 Hypothyroidism, unspecified: Secondary | ICD-10-CM | POA: Diagnosis not present

## 2020-12-23 DIAGNOSIS — D509 Iron deficiency anemia, unspecified: Secondary | ICD-10-CM | POA: Diagnosis not present

## 2020-12-23 DIAGNOSIS — I10 Essential (primary) hypertension: Secondary | ICD-10-CM | POA: Diagnosis not present

## 2020-12-23 DIAGNOSIS — E785 Hyperlipidemia, unspecified: Secondary | ICD-10-CM | POA: Diagnosis not present

## 2020-12-25 DIAGNOSIS — E113511 Type 2 diabetes mellitus with proliferative diabetic retinopathy with macular edema, right eye: Secondary | ICD-10-CM | POA: Diagnosis not present

## 2020-12-25 DIAGNOSIS — E538 Deficiency of other specified B group vitamins: Secondary | ICD-10-CM | POA: Diagnosis not present

## 2020-12-25 DIAGNOSIS — Z125 Encounter for screening for malignant neoplasm of prostate: Secondary | ICD-10-CM | POA: Diagnosis not present

## 2021-01-01 DIAGNOSIS — Z0000 Encounter for general adult medical examination without abnormal findings: Secondary | ICD-10-CM | POA: Diagnosis not present

## 2021-01-01 DIAGNOSIS — E1122 Type 2 diabetes mellitus with diabetic chronic kidney disease: Secondary | ICD-10-CM | POA: Diagnosis not present

## 2021-01-01 DIAGNOSIS — F320 Major depressive disorder, single episode, mild: Secondary | ICD-10-CM | POA: Diagnosis not present

## 2021-01-01 DIAGNOSIS — N183 Chronic kidney disease, stage 3 unspecified: Secondary | ICD-10-CM | POA: Diagnosis not present

## 2021-01-01 DIAGNOSIS — Z23 Encounter for immunization: Secondary | ICD-10-CM | POA: Diagnosis not present

## 2021-01-01 DIAGNOSIS — I69352 Hemiplegia and hemiparesis following cerebral infarction affecting left dominant side: Secondary | ICD-10-CM | POA: Diagnosis not present

## 2021-01-09 ENCOUNTER — Ambulatory Visit: Payer: Self-pay | Admitting: Urology

## 2021-01-14 ENCOUNTER — Emergency Department (HOSPITAL_COMMUNITY): Payer: Medicare HMO

## 2021-01-14 ENCOUNTER — Encounter (HOSPITAL_COMMUNITY): Payer: Self-pay

## 2021-01-14 ENCOUNTER — Inpatient Hospital Stay (HOSPITAL_COMMUNITY)
Admission: EM | Admit: 2021-01-14 | Discharge: 2021-01-27 | DRG: 064 | Disposition: E | Payer: Medicare HMO | Attending: Internal Medicine | Admitting: Internal Medicine

## 2021-01-14 DIAGNOSIS — I6601 Occlusion and stenosis of right middle cerebral artery: Secondary | ICD-10-CM | POA: Diagnosis not present

## 2021-01-14 DIAGNOSIS — Z882 Allergy status to sulfonamides status: Secondary | ICD-10-CM

## 2021-01-14 DIAGNOSIS — S00211A Abrasion of right eyelid and periocular area, initial encounter: Secondary | ICD-10-CM | POA: Diagnosis present

## 2021-01-14 DIAGNOSIS — N1831 Chronic kidney disease, stage 3a: Secondary | ICD-10-CM | POA: Diagnosis present

## 2021-01-14 DIAGNOSIS — S2243XA Multiple fractures of ribs, bilateral, initial encounter for closed fracture: Secondary | ICD-10-CM | POA: Diagnosis not present

## 2021-01-14 DIAGNOSIS — I6389 Other cerebral infarction: Secondary | ICD-10-CM | POA: Diagnosis not present

## 2021-01-14 DIAGNOSIS — I10 Essential (primary) hypertension: Secondary | ICD-10-CM | POA: Diagnosis not present

## 2021-01-14 DIAGNOSIS — D509 Iron deficiency anemia, unspecified: Secondary | ICD-10-CM | POA: Diagnosis present

## 2021-01-14 DIAGNOSIS — I129 Hypertensive chronic kidney disease with stage 1 through stage 4 chronic kidney disease, or unspecified chronic kidney disease: Secondary | ICD-10-CM | POA: Diagnosis present

## 2021-01-14 DIAGNOSIS — F05 Delirium due to known physiological condition: Secondary | ICD-10-CM | POA: Diagnosis present

## 2021-01-14 DIAGNOSIS — E875 Hyperkalemia: Secondary | ICD-10-CM | POA: Diagnosis present

## 2021-01-14 DIAGNOSIS — Z515 Encounter for palliative care: Secondary | ICD-10-CM

## 2021-01-14 DIAGNOSIS — I639 Cerebral infarction, unspecified: Secondary | ICD-10-CM | POA: Diagnosis not present

## 2021-01-14 DIAGNOSIS — L80 Vitiligo: Secondary | ICD-10-CM | POA: Diagnosis present

## 2021-01-14 DIAGNOSIS — R339 Retention of urine, unspecified: Secondary | ICD-10-CM | POA: Diagnosis not present

## 2021-01-14 DIAGNOSIS — X509XXA Other and unspecified overexertion or strenuous movements or postures, initial encounter: Secondary | ICD-10-CM | POA: Diagnosis not present

## 2021-01-14 DIAGNOSIS — R0689 Other abnormalities of breathing: Secondary | ICD-10-CM | POA: Diagnosis not present

## 2021-01-14 DIAGNOSIS — E039 Hypothyroidism, unspecified: Secondary | ICD-10-CM | POA: Diagnosis present

## 2021-01-14 DIAGNOSIS — Z79890 Hormone replacement therapy (postmenopausal): Secondary | ICD-10-CM

## 2021-01-14 DIAGNOSIS — N189 Chronic kidney disease, unspecified: Secondary | ICD-10-CM | POA: Diagnosis present

## 2021-01-14 DIAGNOSIS — E1151 Type 2 diabetes mellitus with diabetic peripheral angiopathy without gangrene: Secondary | ICD-10-CM | POA: Diagnosis present

## 2021-01-14 DIAGNOSIS — S22059A Unspecified fracture of T5-T6 vertebra, initial encounter for closed fracture: Secondary | ICD-10-CM | POA: Diagnosis not present

## 2021-01-14 DIAGNOSIS — N320 Bladder-neck obstruction: Secondary | ICD-10-CM | POA: Diagnosis present

## 2021-01-14 DIAGNOSIS — Y92410 Unspecified street and highway as the place of occurrence of the external cause: Secondary | ICD-10-CM | POA: Diagnosis not present

## 2021-01-14 DIAGNOSIS — N179 Acute kidney failure, unspecified: Secondary | ICD-10-CM | POA: Diagnosis not present

## 2021-01-14 DIAGNOSIS — Z9049 Acquired absence of other specified parts of digestive tract: Secondary | ICD-10-CM | POA: Diagnosis not present

## 2021-01-14 DIAGNOSIS — E538 Deficiency of other specified B group vitamins: Secondary | ICD-10-CM | POA: Diagnosis present

## 2021-01-14 DIAGNOSIS — G459 Transient cerebral ischemic attack, unspecified: Secondary | ICD-10-CM | POA: Diagnosis not present

## 2021-01-14 DIAGNOSIS — R29709 NIHSS score 9: Secondary | ICD-10-CM | POA: Diagnosis present

## 2021-01-14 DIAGNOSIS — R471 Dysarthria and anarthria: Secondary | ICD-10-CM | POA: Diagnosis present

## 2021-01-14 DIAGNOSIS — I69354 Hemiplegia and hemiparesis following cerebral infarction affecting left non-dominant side: Secondary | ICD-10-CM

## 2021-01-14 DIAGNOSIS — I63411 Cerebral infarction due to embolism of right middle cerebral artery: Principal | ICD-10-CM | POA: Diagnosis present

## 2021-01-14 DIAGNOSIS — M6289 Other specified disorders of muscle: Secondary | ICD-10-CM | POA: Diagnosis not present

## 2021-01-14 DIAGNOSIS — S22020A Wedge compression fracture of second thoracic vertebra, initial encounter for closed fracture: Secondary | ICD-10-CM | POA: Diagnosis not present

## 2021-01-14 DIAGNOSIS — S22060A Wedge compression fracture of T7-T8 vertebra, initial encounter for closed fracture: Secondary | ICD-10-CM | POA: Diagnosis not present

## 2021-01-14 DIAGNOSIS — E785 Hyperlipidemia, unspecified: Secondary | ICD-10-CM | POA: Diagnosis present

## 2021-01-14 DIAGNOSIS — S12201A Unspecified nondisplaced fracture of third cervical vertebra, initial encounter for closed fracture: Secondary | ICD-10-CM | POA: Diagnosis not present

## 2021-01-14 DIAGNOSIS — Z66 Do not resuscitate: Secondary | ICD-10-CM | POA: Diagnosis not present

## 2021-01-14 DIAGNOSIS — S22000A Wedge compression fracture of unspecified thoracic vertebra, initial encounter for closed fracture: Secondary | ICD-10-CM | POA: Diagnosis present

## 2021-01-14 DIAGNOSIS — Z7984 Long term (current) use of oral hypoglycemic drugs: Secondary | ICD-10-CM

## 2021-01-14 DIAGNOSIS — E1142 Type 2 diabetes mellitus with diabetic polyneuropathy: Secondary | ICD-10-CM | POA: Diagnosis present

## 2021-01-14 DIAGNOSIS — I700 Atherosclerosis of aorta: Secondary | ICD-10-CM | POA: Diagnosis present

## 2021-01-14 DIAGNOSIS — N281 Cyst of kidney, acquired: Secondary | ICD-10-CM | POA: Diagnosis not present

## 2021-01-14 DIAGNOSIS — J9601 Acute respiratory failure with hypoxia: Secondary | ICD-10-CM | POA: Diagnosis not present

## 2021-01-14 DIAGNOSIS — R29818 Other symptoms and signs involving the nervous system: Secondary | ICD-10-CM | POA: Diagnosis not present

## 2021-01-14 DIAGNOSIS — R0902 Hypoxemia: Secondary | ICD-10-CM | POA: Diagnosis not present

## 2021-01-14 DIAGNOSIS — M542 Cervicalgia: Secondary | ICD-10-CM | POA: Diagnosis not present

## 2021-01-14 DIAGNOSIS — N390 Urinary tract infection, site not specified: Secondary | ICD-10-CM

## 2021-01-14 DIAGNOSIS — Z803 Family history of malignant neoplasm of breast: Secondary | ICD-10-CM

## 2021-01-14 DIAGNOSIS — R404 Transient alteration of awareness: Secondary | ICD-10-CM | POA: Diagnosis not present

## 2021-01-14 DIAGNOSIS — Z20822 Contact with and (suspected) exposure to covid-19: Secondary | ICD-10-CM | POA: Diagnosis not present

## 2021-01-14 DIAGNOSIS — S3991XA Unspecified injury of abdomen, initial encounter: Secondary | ICD-10-CM | POA: Diagnosis not present

## 2021-01-14 DIAGNOSIS — E1122 Type 2 diabetes mellitus with diabetic chronic kidney disease: Secondary | ICD-10-CM | POA: Diagnosis present

## 2021-01-14 DIAGNOSIS — R29810 Facial weakness: Secondary | ICD-10-CM | POA: Diagnosis present

## 2021-01-14 DIAGNOSIS — S3993XA Unspecified injury of pelvis, initial encounter: Secondary | ICD-10-CM | POA: Diagnosis not present

## 2021-01-14 DIAGNOSIS — I6529 Occlusion and stenosis of unspecified carotid artery: Secondary | ICD-10-CM | POA: Diagnosis present

## 2021-01-14 DIAGNOSIS — Z87891 Personal history of nicotine dependence: Secondary | ICD-10-CM

## 2021-01-14 DIAGNOSIS — S2249XA Multiple fractures of ribs, unspecified side, initial encounter for closed fracture: Secondary | ICD-10-CM | POA: Diagnosis present

## 2021-01-14 DIAGNOSIS — R410 Disorientation, unspecified: Secondary | ICD-10-CM | POA: Diagnosis not present

## 2021-01-14 DIAGNOSIS — M4854XA Collapsed vertebra, not elsewhere classified, thoracic region, initial encounter for fracture: Secondary | ICD-10-CM | POA: Diagnosis present

## 2021-01-14 DIAGNOSIS — Z79899 Other long term (current) drug therapy: Secondary | ICD-10-CM | POA: Diagnosis not present

## 2021-01-14 DIAGNOSIS — R531 Weakness: Secondary | ICD-10-CM | POA: Diagnosis not present

## 2021-01-14 DIAGNOSIS — R059 Cough, unspecified: Secondary | ICD-10-CM

## 2021-01-14 DIAGNOSIS — M1990 Unspecified osteoarthritis, unspecified site: Secondary | ICD-10-CM | POA: Diagnosis present

## 2021-01-14 DIAGNOSIS — F32A Depression, unspecified: Secondary | ICD-10-CM | POA: Diagnosis present

## 2021-01-14 DIAGNOSIS — T1490XA Injury, unspecified, initial encounter: Secondary | ICD-10-CM

## 2021-01-14 DIAGNOSIS — E119 Type 2 diabetes mellitus without complications: Secondary | ICD-10-CM

## 2021-01-14 DIAGNOSIS — I2510 Atherosclerotic heart disease of native coronary artery without angina pectoris: Secondary | ICD-10-CM | POA: Diagnosis present

## 2021-01-14 DIAGNOSIS — R2990 Unspecified symptoms and signs involving the nervous system: Secondary | ICD-10-CM

## 2021-01-14 DIAGNOSIS — R209 Unspecified disturbances of skin sensation: Secondary | ICD-10-CM | POA: Diagnosis not present

## 2021-01-14 DIAGNOSIS — F0390 Unspecified dementia without behavioral disturbance: Secondary | ICD-10-CM | POA: Diagnosis present

## 2021-01-14 DIAGNOSIS — Z7982 Long term (current) use of aspirin: Secondary | ICD-10-CM

## 2021-01-14 DIAGNOSIS — Z794 Long term (current) use of insulin: Secondary | ICD-10-CM

## 2021-01-14 DIAGNOSIS — Z041 Encounter for examination and observation following transport accident: Secondary | ICD-10-CM | POA: Diagnosis not present

## 2021-01-14 DIAGNOSIS — D72829 Elevated white blood cell count, unspecified: Secondary | ICD-10-CM | POA: Diagnosis present

## 2021-01-14 DIAGNOSIS — I6522 Occlusion and stenosis of left carotid artery: Secondary | ICD-10-CM | POA: Diagnosis not present

## 2021-01-14 DIAGNOSIS — I70208 Unspecified atherosclerosis of native arteries of extremities, other extremity: Secondary | ICD-10-CM | POA: Diagnosis present

## 2021-01-14 LAB — I-STAT CHEM 8, ED
BUN: 26 mg/dL — ABNORMAL HIGH (ref 8–23)
Calcium, Ion: 1.11 mmol/L — ABNORMAL LOW (ref 1.15–1.40)
Chloride: 103 mmol/L (ref 98–111)
Creatinine, Ser: 1.9 mg/dL — ABNORMAL HIGH (ref 0.61–1.24)
Glucose, Bld: 174 mg/dL — ABNORMAL HIGH (ref 70–99)
HCT: 35 % — ABNORMAL LOW (ref 39.0–52.0)
Hemoglobin: 11.9 g/dL — ABNORMAL LOW (ref 13.0–17.0)
Potassium: 4.9 mmol/L (ref 3.5–5.1)
Sodium: 139 mmol/L (ref 135–145)
TCO2: 24 mmol/L (ref 22–32)

## 2021-01-14 LAB — COMPREHENSIVE METABOLIC PANEL
ALT: 38 U/L (ref 0–44)
AST: 54 U/L — ABNORMAL HIGH (ref 15–41)
Albumin: 3.8 g/dL (ref 3.5–5.0)
Alkaline Phosphatase: 60 U/L (ref 38–126)
Anion gap: 9 (ref 5–15)
BUN: 22 mg/dL (ref 8–23)
CO2: 24 mmol/L (ref 22–32)
Calcium: 8.7 mg/dL — ABNORMAL LOW (ref 8.9–10.3)
Chloride: 105 mmol/L (ref 98–111)
Creatinine, Ser: 1.88 mg/dL — ABNORMAL HIGH (ref 0.61–1.24)
GFR, Estimated: 35 mL/min — ABNORMAL LOW (ref >60)
Glucose, Bld: 180 mg/dL — ABNORMAL HIGH (ref 70–99)
Potassium: 4.8 mmol/L (ref 3.5–5.1)
Sodium: 138 mmol/L (ref 135–145)
Total Bilirubin: 0.9 mg/dL (ref 0.3–1.2)
Total Protein: 7 g/dL (ref 6.5–8.1)

## 2021-01-14 LAB — CBG MONITORING, ED: Glucose-Capillary: 156 mg/dL — ABNORMAL HIGH (ref 70–99)

## 2021-01-14 LAB — CBC
HCT: 35.5 % — ABNORMAL LOW (ref 39.0–52.0)
Hemoglobin: 10.9 g/dL — ABNORMAL LOW (ref 13.0–17.0)
MCH: 27.1 pg (ref 26.0–34.0)
MCHC: 30.7 g/dL (ref 30.0–36.0)
MCV: 88.3 fL (ref 80.0–100.0)
Platelets: 187 10*3/uL (ref 150–400)
RBC: 4.02 MIL/uL — ABNORMAL LOW (ref 4.22–5.81)
RDW: 14 % (ref 11.5–15.5)
WBC: 16.8 10*3/uL — ABNORMAL HIGH (ref 4.0–10.5)
nRBC: 0 % (ref 0.0–0.2)

## 2021-01-14 LAB — DIFFERENTIAL
Abs Immature Granulocytes: 0.24 10*3/uL — ABNORMAL HIGH (ref 0.00–0.07)
Basophils Absolute: 0 10*3/uL (ref 0.0–0.1)
Basophils Relative: 0 %
Eosinophils Absolute: 0.2 10*3/uL (ref 0.0–0.5)
Eosinophils Relative: 1 %
Immature Granulocytes: 1 %
Lymphocytes Relative: 16 %
Lymphs Abs: 2.7 10*3/uL (ref 0.7–4.0)
Monocytes Absolute: 0.9 10*3/uL (ref 0.1–1.0)
Monocytes Relative: 5 %
Neutro Abs: 12.7 10*3/uL — ABNORMAL HIGH (ref 1.7–7.7)
Neutrophils Relative %: 77 %

## 2021-01-14 LAB — RESP PANEL BY RT-PCR (FLU A&B, COVID) ARPGX2
Influenza A by PCR: NEGATIVE
Influenza B by PCR: NEGATIVE
SARS Coronavirus 2 by RT PCR: NEGATIVE

## 2021-01-14 LAB — PROTIME-INR
INR: 1 (ref 0.8–1.2)
Prothrombin Time: 13.2 seconds (ref 11.4–15.2)

## 2021-01-14 LAB — APTT: aPTT: 29 seconds (ref 24–36)
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
## 2021-01-14 IMAGING — CT CT HEAD CODE STROKE
3 series · 14 of 47 positions shown, 16 images · non-contrast
Comparison: [DATE]

CLINICAL DATA: Code stroke. Neurological deficit. Acute stroke
suspected. Specific deficit not described.

EXAM:
CT HEAD WITHOUT CONTRAST
TECHNIQUE: Contiguous axial images were obtained from the base of the skull
through the vertex without intravenous contrast.

[Series 4: head 5.0 st · axial · 0.50mm/px · z∈[-54,+96]mm · 8 of 36 slices shown, 10 images]
[im 3/36  brain]
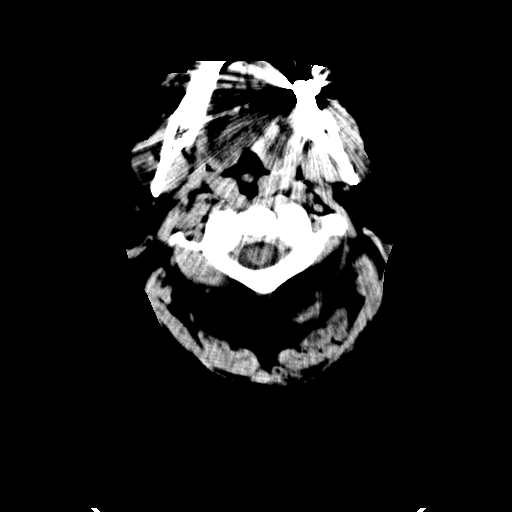
[im 3/36  bone]
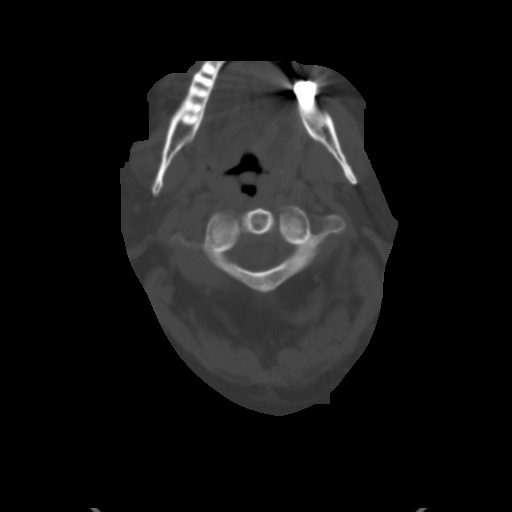
[im 8/36  brain]
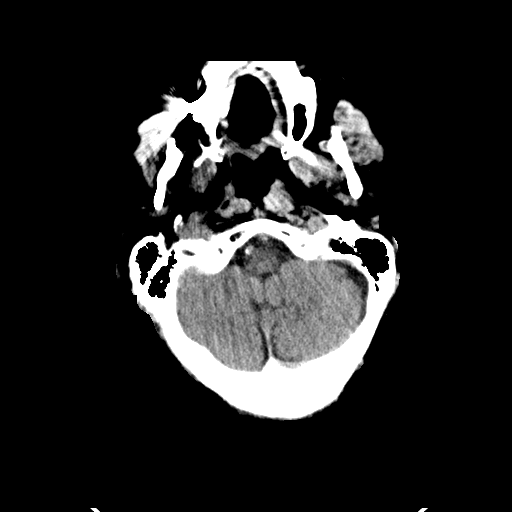
[im 11/36  brain]
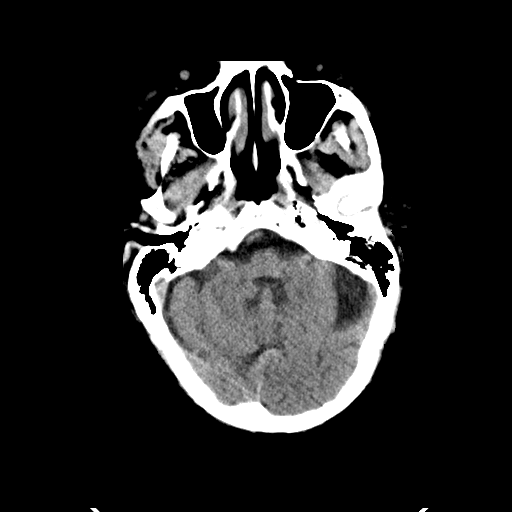
[im 16/36  brain]
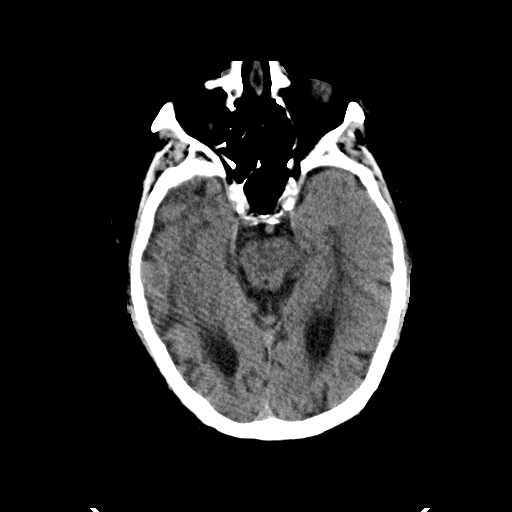
[im 20/36  brain]
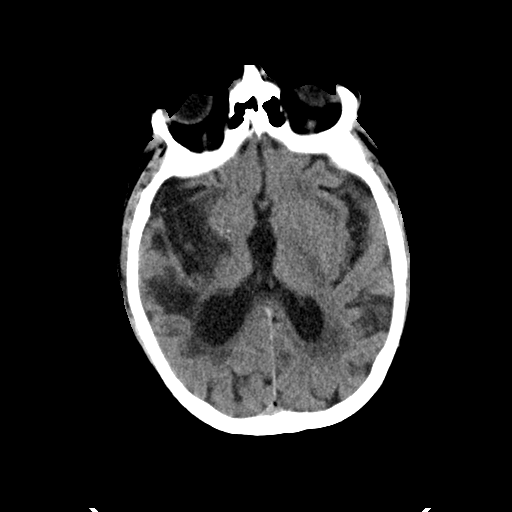
[im 20/36  bone]
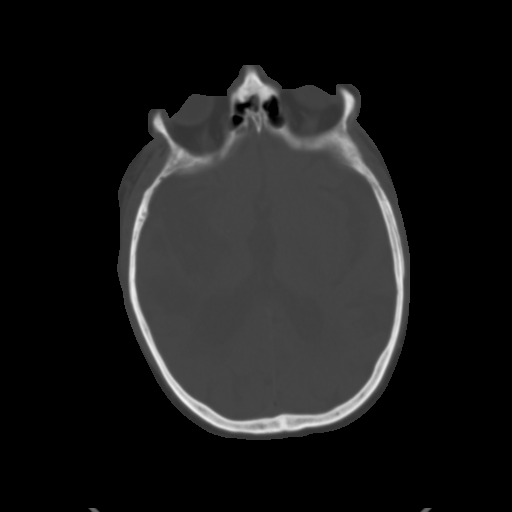
[im 25/36  brain]
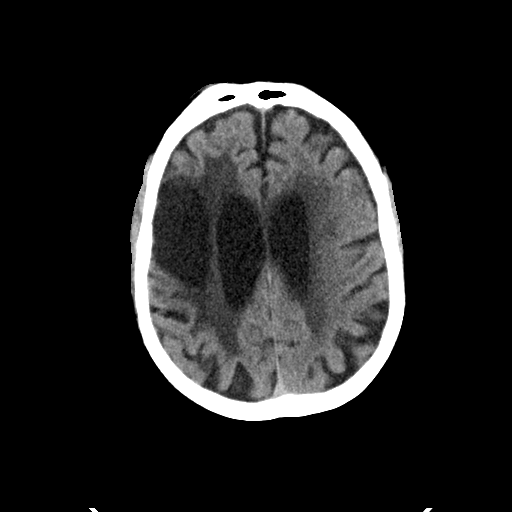
[im 28/36  brain]
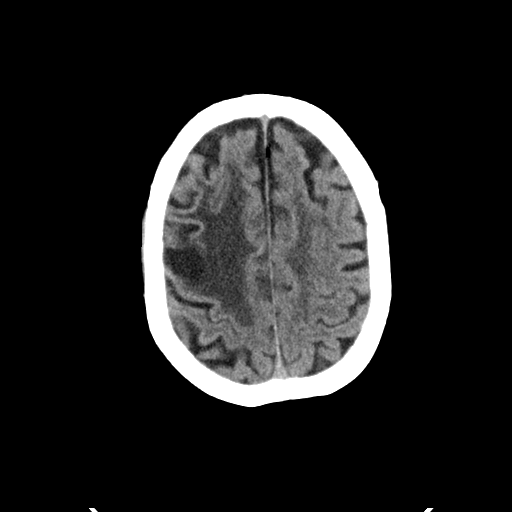
[im 33/36  brain]
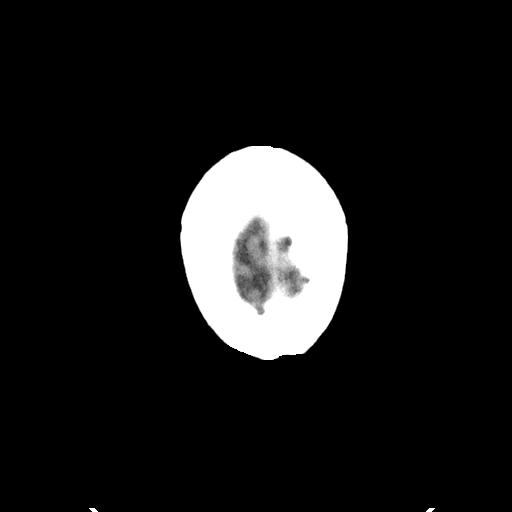

[Series 6: head 3.0 cor st · coronal · 0.41mm/px · 3 of 74 slices shown]
[im 25/74  brain]
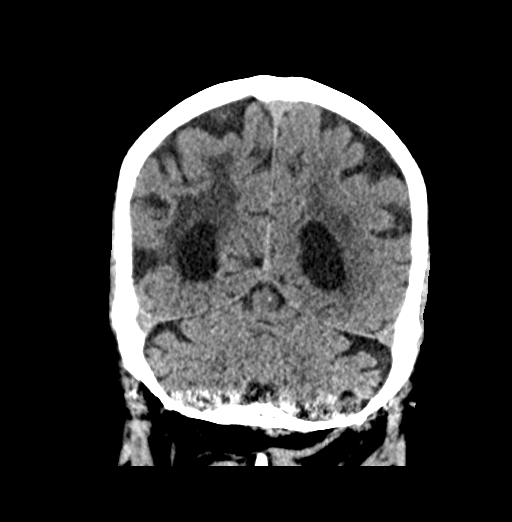
[im 33/74  brain]
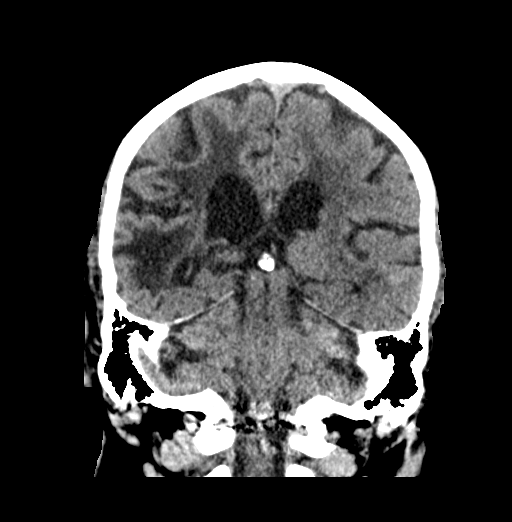
[im 41/74  brain]
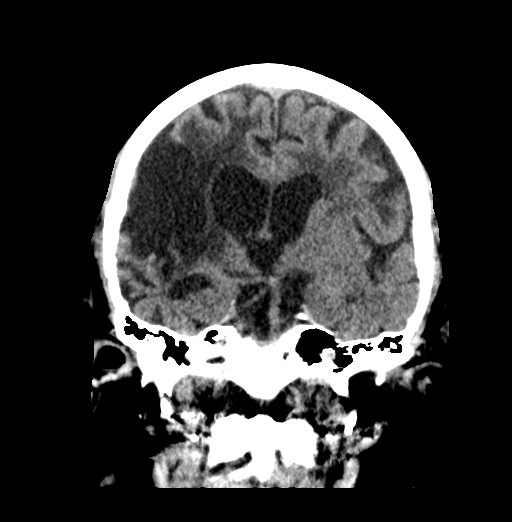

[Series 7: head 3.0 sag st · sagittal · 0.35mm/px · 3 of 67 slices shown]
[im 23/67  brain]
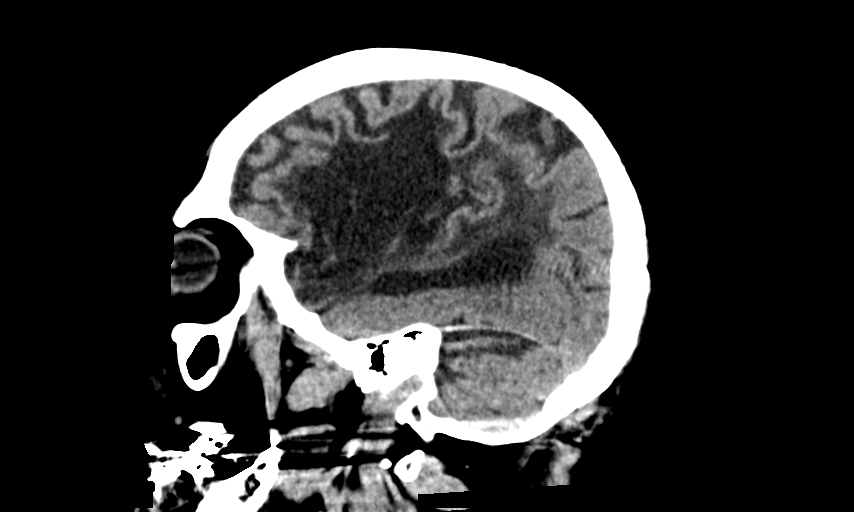
[im 34/67  brain]
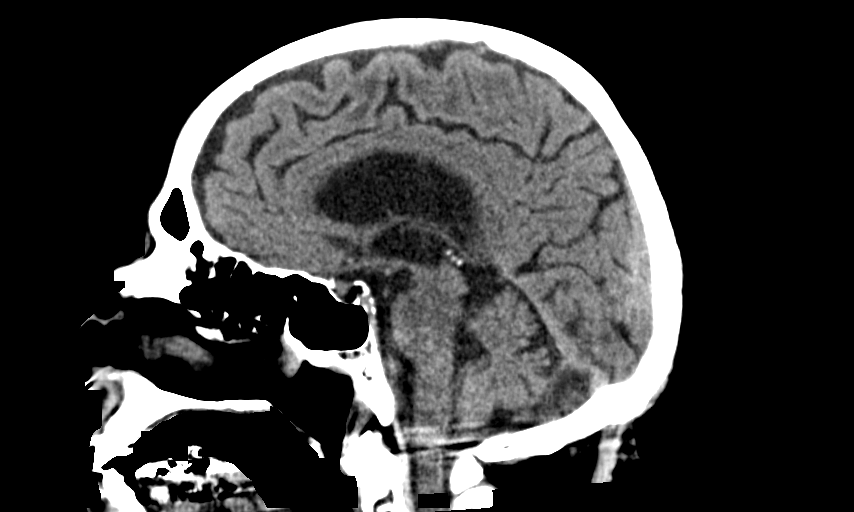
[im 45/67  brain]
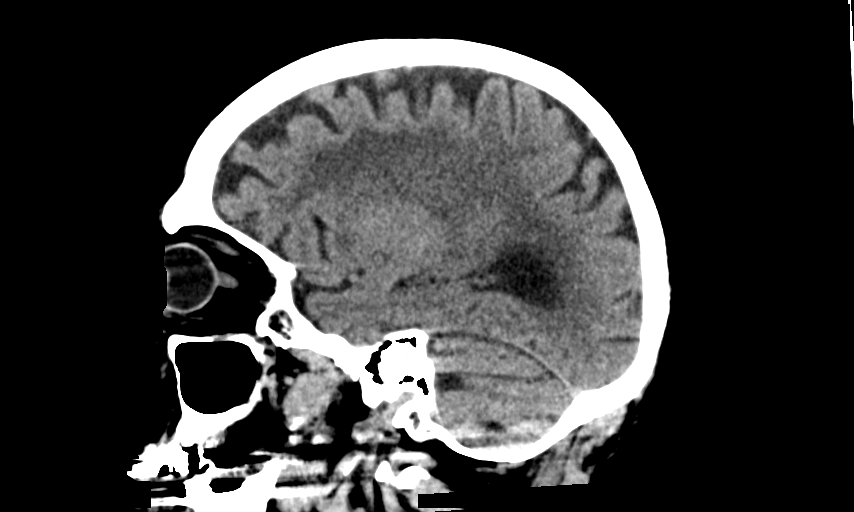

[14 of 47 positions shown; findings below may reference images not displayed]

FINDINGS: Brain: No focal abnormality seen affecting the brainstem or
cerebellum. Left cerebral hemisphere shows chronic small-vessel
ischemic changes of the white matter but no acute finding. Right
cerebral hemisphere shows old infarction in the right middle
cerebral artery territory with atrophy, encephalomalacia and
gliosis. No sign of acute infarction, mass lesion, hemorrhage,
hydrocephalus or extra-axial collection.

Vascular: There is atherosclerotic calcification of the major
vessels at the base of the brain.

Skull: Negative

Sinuses/Orbits: Clear/normal

Other: None

ASPECTS (Alberta Stroke Program Early CT Score)

- Ganglionic level infarction (caudate, lentiform nuclei, internal
capsule, insula, M1-M3 cortex): 7, allowing for the old right MCA
infarction.

- Supraganglionic infarction (M4-M6 cortex): 3, allowing for the old
right MCA infarction.

Total score (0-10 with 10 being normal): 10, allowing for the old
right MCA infarction.
IMPRESSION: 1. No acute finding by CT. Old right MCA infarction. Chronic
small-vessel ischemic changes of the cerebral hemispheres.
2. ASPECTS is 10, allowing for the old right MCA infarction.
3. These results were called by telephone at the time of
interpretation on [DATE] at [DATE] to provider ZERPA ,
who verbally acknowledged these results.
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
## 2021-01-14 MED ORDER — ACETAMINOPHEN 325 MG PO TABS
650.0000 mg | ORAL_TABLET | Freq: Four times a day (QID) | ORAL | Status: DC | PRN
  Administered 2021-01-15 – 2021-01-16 (×2): 650 mg via ORAL
  Filled 2021-01-14 (×2): qty 2

## 2021-01-14 MED ORDER — IOHEXOL 350 MG/ML SOLN
100.0000 mL | Freq: Once | INTRAVENOUS | Status: AC | PRN
  Administered 2021-01-14: 100 mL via INTRAVENOUS

## 2021-01-14 MED ORDER — CLOPIDOGREL BISULFATE 75 MG PO TABS: 300.0000 mg | ORAL_TABLET | Freq: Once | ORAL | Status: DC

## 2021-01-14 MED ORDER — CLOPIDOGREL BISULFATE 75 MG PO TABS
75.0000 mg | ORAL_TABLET | Freq: Every day | ORAL | Status: DC
  Administered 2021-01-15: 75 mg via ORAL
  Filled 2021-01-14: qty 1

## 2021-01-14 MED ORDER — ACETAMINOPHEN 650 MG RE SUPP
650.0000 mg | Freq: Four times a day (QID) | RECTAL | Status: DC | PRN
  Administered 2021-01-15: 650 mg via RECTAL
  Filled 2021-01-14: qty 1

## 2021-01-14 MED ORDER — POLYETHYLENE GLYCOL 3350 17 G PO PACK: 17.0000 g | PACK | Freq: Every day | ORAL | Status: DC | PRN

## 2021-01-14 MED ORDER — MORPHINE SULFATE (PF) 4 MG/ML IV SOLN
4.0000 mg | Freq: Once | INTRAVENOUS | Status: AC
  Administered 2021-01-14: 4 mg via INTRAVENOUS
  Filled 2021-01-14: qty 1

## 2021-01-14 MED ORDER — SODIUM CHLORIDE 0.9% FLUSH
3.0000 mL | Freq: Once | INTRAVENOUS | Status: AC
  Administered 2021-01-14: 3 mL via INTRAVENOUS

## 2021-01-14 NOTE — Progress Notes (Signed)
Orthopedic Tech Progress Note Patient Details:  Eric Lopez March 06, 1941 [id number] TLSO Brace has been applied to patient  Patient ID: Eric Lopez, male   DOB: 09-09-1941, 80 y.o.   MRN: [id number]   Eric Lopez 01/18/2021, 8:24 PM
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
## 2021-01-14 NOTE — ED Notes (Signed)
Dr white with trauma at bedside
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
## 2021-01-14 NOTE — ED Notes (Signed)
Ortho aware of order for TLSO brace

## 2021-01-14 NOTE — ED Notes (Signed)
RN attempted report 

## 2021-01-14 NOTE — ED Notes (Signed)
Pt has removed the miami j collar

## 2021-01-14 NOTE — H&P (Addendum)
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 Suspect pre-renal. Will trial gentle fluids and follow-up repeat BMP. - LR 100 cc/hr for 10 hrs - AM BMP  Hyperlipidemia - Continue home simvastatin 40 mg daily - Lipid panel as above  Hypothyroidism - Continue home levothyroxine 100 mcg daily  Bladder outlet obstruction Follows with urology. Had foley placed during admission in Dec 2021 which was recently removed. On tamsulosin 0.4 mg daily which we will hold in setting of permissive hypertension for acute stroke. 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 - Med rec  Diet: NPO until SLP evaluation VTE: SCDs IVF: None Code: Full  Prior to Admission Living Arrangement: Home, living with wife Anticipated Discharge Location: TBD Barriers to Discharge: further work-up and evaluation  Dispo: Admit patient to Inpatient with expected length of stay greater than 2 midnights.  Signed: Alexandria Lodge, MD PGY-1 Internal Medicine Teaching Service Pager: [phone] 01/11/2021

## 2021-01-14 NOTE — ED Notes (Signed)
Patient transported to MRI 

## 2021-01-14 NOTE — ED Notes (Signed)
[place] collar applied

## 2021-01-14 NOTE — ED Provider Notes (Signed)
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 osteophyte.  Possibly related to hyperextension injury. No evidence of central cord syndrome on exam.  Wife updated at bedside.  I'll consult trauma [MT]  7616 His wife states he is at his mental baseline now, and that he occasionally speaks slowly.  He has chronic left sided arm weakness and leg weakness but can walk with a cane at baseline. [MT]  0737 . Nondisplaced posteromedial bilateral first rib fractures. Anterior superior T2 vertebral compression fracture. Anterior inferior T6 vertebral compression fracture. 2. Prominent rounded asymmetric enlargement of the right sternocleidomastoid muscle, asymmetrically mildly hyperdense with surrounding fat stranding extending into the anterior upper right mediastinum, compatible with muscular hematoma. 3. No additional acute traumatic injury in the chest, abdomen or pelvis. 4. One vessel coronary atherosclerosis. 5. Mild cardiomegaly. 6. Dilated main pulmonary artery,  chronic, suggesting chronic pulmonary arterial hypertension. 7. Chronic mild diffuse bladder wall thickening presumably due to chronic bladder voiding dysfunction. 8. Aortic Atherosclerosis (ICD10-I70.0). [MT]  1828 Consulted Dr Dema Severin from trauma surgery, he's asked me to reach out to neurosurgery regarding spine fx, and medicine team regarding admission for syncope/AMS/stroke workup.   [MT]  1902 Dr Venetia Constable recommends TSLO brace for now, we do not need to maintain C-spine now as C3 finding is likely osteophyte.  Collar cleared. [MT]  1930 Signed out to IM teaching service hospitalist [MT]    Clinical Course User Index [MT] Delorus Langwell, Carola Rhine, MD    Final Clinical Impression(s) / ED Diagnoses Final diagnoses:  Trauma    Rx / DC Orders ED Discharge Orders    None       Wyvonnia Dusky, MD 12/30/2020 2003
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
## 2021-01-15 ENCOUNTER — Encounter (HOSPITAL_COMMUNITY): Payer: Self-pay | Admitting: Internal Medicine

## 2021-01-15 ENCOUNTER — Inpatient Hospital Stay (HOSPITAL_COMMUNITY): Payer: Medicare HMO

## 2021-01-15 ENCOUNTER — Other Ambulatory Visit: Payer: Self-pay

## 2021-01-15 DIAGNOSIS — S22000A Wedge compression fracture of unspecified thoracic vertebra, initial encounter for closed fracture: Secondary | ICD-10-CM | POA: Diagnosis present

## 2021-01-15 DIAGNOSIS — S2243XA Multiple fractures of ribs, bilateral, initial encounter for closed fracture: Secondary | ICD-10-CM | POA: Diagnosis not present

## 2021-01-15 DIAGNOSIS — I6389 Other cerebral infarction: Secondary | ICD-10-CM | POA: Diagnosis not present

## 2021-01-15 DIAGNOSIS — R339 Retention of urine, unspecified: Secondary | ICD-10-CM

## 2021-01-15 DIAGNOSIS — N179 Acute kidney failure, unspecified: Secondary | ICD-10-CM | POA: Diagnosis not present

## 2021-01-15 DIAGNOSIS — S2249XA Multiple fractures of ribs, unspecified side, initial encounter for closed fracture: Secondary | ICD-10-CM | POA: Diagnosis present

## 2021-01-15 DIAGNOSIS — I639 Cerebral infarction, unspecified: Secondary | ICD-10-CM | POA: Diagnosis not present

## 2021-01-15 DIAGNOSIS — E119 Type 2 diabetes mellitus without complications: Secondary | ICD-10-CM

## 2021-01-15 LAB — BASIC METABOLIC PANEL
Anion gap: 16 — ABNORMAL HIGH (ref 5–15)
BUN: 27 mg/dL — ABNORMAL HIGH (ref 8–23)
CO2: 19 mmol/L — ABNORMAL LOW (ref 22–32)
Calcium: 8.7 mg/dL — ABNORMAL LOW (ref 8.9–10.3)
Chloride: 100 mmol/L (ref 98–111)
Creatinine, Ser: 2.03 mg/dL — ABNORMAL HIGH (ref 0.61–1.24)
GFR, Estimated: 32 mL/min — ABNORMAL LOW (ref >60)
Glucose, Bld: 305 mg/dL — ABNORMAL HIGH (ref 70–99)
Potassium: 5 mmol/L (ref 3.5–5.1)
Sodium: 135 mmol/L (ref 135–145)

## 2021-01-15 LAB — CBC
HCT: 34 % — ABNORMAL LOW (ref 39.0–52.0)
Hemoglobin: 10.8 g/dL — ABNORMAL LOW (ref 13.0–17.0)
MCH: 27.5 pg (ref 26.0–34.0)
MCHC: 31.8 g/dL (ref 30.0–36.0)
MCV: 86.5 fL (ref 80.0–100.0)
Platelets: 214 10*3/uL (ref 150–400)
RBC: 3.93 MIL/uL — ABNORMAL LOW (ref 4.22–5.81)
RDW: 14.2 % (ref 11.5–15.5)
WBC: 14.8 10*3/uL — ABNORMAL HIGH (ref 4.0–10.5)
nRBC: 0 % (ref 0.0–0.2)

## 2021-01-15 LAB — LIPID PANEL
Cholesterol: 164 mg/dL (ref 0–200)
HDL: 53 mg/dL (ref >40)
LDL Cholesterol: 83 mg/dL (ref 0–99)
Total CHOL/HDL Ratio: 3.1 RATIO
Triglycerides: 140 mg/dL (ref <150)
VLDL: 28 mg/dL (ref 0–40)

## 2021-01-15 LAB — ECHOCARDIOGRAM COMPLETE
Height: 66 in
S' Lateral: 2.4 cm
Weight: 2843.05 oz

## 2021-01-15 LAB — HEMOGLOBIN A1C
Hgb A1c MFr Bld: 8.8 % — ABNORMAL HIGH (ref 4.8–5.6)
Mean Plasma Glucose: 205.86 mg/dL

## 2021-01-15 LAB — GLUCOSE, CAPILLARY
Glucose-Capillary: 264 mg/dL — ABNORMAL HIGH (ref 70–99)
Glucose-Capillary: 192 mg/dL — ABNORMAL HIGH (ref 70–99)
Glucose-Capillary: 246 mg/dL — ABNORMAL HIGH (ref 70–99)
Glucose-Capillary: 245 mg/dL — ABNORMAL HIGH (ref 70–99)

## 2021-01-15 MED ORDER — METHOCARBAMOL 500 MG PO TABS
500.0000 mg | ORAL_TABLET | Freq: Three times a day (TID) | ORAL | Status: DC | PRN
  Administered 2021-01-15 – 2021-01-21 (×7): 500 mg via ORAL
  Filled 2021-01-15 (×7): qty 1

## 2021-01-15 MED ORDER — INSULIN ASPART 100 UNIT/ML ~~LOC~~ SOLN
0.0000 [IU] | Freq: Three times a day (TID) | SUBCUTANEOUS | Status: DC
  Administered 2021-01-15: 3 [IU] via SUBCUTANEOUS
  Administered 2021-01-15: 8 [IU] via SUBCUTANEOUS
  Administered 2021-01-15 – 2021-01-16 (×3): 5 [IU] via SUBCUTANEOUS
  Administered 2021-01-17: 3 [IU] via SUBCUTANEOUS
  Administered 2021-01-17: 2 [IU] via SUBCUTANEOUS
  Administered 2021-01-17: 3 [IU] via SUBCUTANEOUS
  Administered 2021-01-18: 2 [IU] via SUBCUTANEOUS
  Administered 2021-01-18: 3 [IU] via SUBCUTANEOUS
  Administered 2021-01-18: 5 [IU] via SUBCUTANEOUS
  Administered 2021-01-19 – 2021-01-20 (×4): 3 [IU] via SUBCUTANEOUS
  Administered 2021-01-20: 5 [IU] via SUBCUTANEOUS
  Administered 2021-01-21 (×2): 3 [IU] via SUBCUTANEOUS
  Administered 2021-01-22: 2 [IU] via SUBCUTANEOUS
  Administered 2021-01-22 (×2): 3 [IU] via SUBCUTANEOUS

## 2021-01-15 MED ORDER — ASPIRIN EC 81 MG PO TBEC
81.0000 mg | DELAYED_RELEASE_TABLET | Freq: Every day | ORAL | Status: DC
  Administered 2021-01-15 – 2021-01-21 (×7): 81 mg via ORAL
  Filled 2021-01-15 (×7): qty 1

## 2021-01-15 MED ORDER — SODIUM CHLORIDE 0.9 % IV SOLN: INTRAVENOUS | Status: AC

## 2021-01-15 MED ORDER — POLYETHYLENE GLYCOL 3350 17 G PO PACK
17.0000 g | PACK | Freq: Every day | ORAL | Status: DC
  Administered 2021-01-15 – 2021-01-20 (×6): 17 g via ORAL
  Filled 2021-01-15 (×8): qty 1

## 2021-01-15 MED ORDER — LACTATED RINGERS IV SOLN: INTRAVENOUS | Status: AC

## 2021-01-15 MED ORDER — LEVOTHYROXINE SODIUM 100 MCG PO TABS
100.0000 ug | ORAL_TABLET | Freq: Every day | ORAL | Status: DC
  Administered 2021-01-16 – 2021-01-22 (×7): 100 ug via ORAL
  Filled 2021-01-15 (×7): qty 1

## 2021-01-15 MED ORDER — STROKE: EARLY STAGES OF RECOVERY BOOK
Status: AC
  Filled 2021-01-15: qty 1

## 2021-01-15 MED ORDER — PANTOPRAZOLE SODIUM 40 MG PO TBEC
40.0000 mg | DELAYED_RELEASE_TABLET | Freq: Every day | ORAL | Status: DC
  Administered 2021-01-15 – 2021-01-21 (×7): 40 mg via ORAL
  Filled 2021-01-15 (×8): qty 1

## 2021-01-15 MED ORDER — HYDROMORPHONE HCL 1 MG/ML IJ SOLN
0.5000 mg | INTRAMUSCULAR | Status: AC | PRN
Start: 2021-01-15 — End: 2021-01-18
  Administered 2021-01-18: 0.5 mg via INTRAVENOUS
  Filled 2021-01-15: qty 0.5

## 2021-01-15 MED ORDER — OXYCODONE HCL 5 MG PO TABS
5.0000 mg | ORAL_TABLET | ORAL | Status: DC | PRN
  Administered 2021-01-15 – 2021-01-22 (×11): 5 mg via ORAL
  Filled 2021-01-15 (×12): qty 1

## 2021-01-15 MED ORDER — OXYCODONE HCL 5 MG PO TABS: 2.5000 mg | ORAL_TABLET | Freq: Four times a day (QID) | ORAL | Status: DC | PRN

## 2021-01-15 MED ORDER — SIMVASTATIN 20 MG PO TABS: 40.0000 mg | ORAL_TABLET | Freq: Every day | ORAL | Status: DC

## 2021-01-15 MED ORDER — ATORVASTATIN CALCIUM 40 MG PO TABS
40.0000 mg | ORAL_TABLET | Freq: Every day | ORAL | Status: DC
  Administered 2021-01-15 – 2021-01-21 (×7): 40 mg via ORAL
  Filled 2021-01-15 (×8): qty 1

## 2021-01-15 MED ORDER — INSULIN GLARGINE 100 UNIT/ML ~~LOC~~ SOLN
12.0000 [IU] | Freq: Every day | SUBCUTANEOUS | Status: DC
  Administered 2021-01-15 – 2021-01-16 (×2): 12 [IU] via SUBCUTANEOUS
  Filled 2021-01-15 (×3): qty 0.12
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
## 2021-01-15 NOTE — Progress Notes (Signed)
TELE sitter in place for monitoring to ensure patient's safety.

## 2021-01-15 NOTE — Progress Notes (Signed)
I misspoke and he is not on lovenox therefore, Dr Shon Baton did respond back and said unfortunately we cannot discontinue the SCDs and we do still need to continue to attempt to get him to wear them due to his risk of hemorrhagic conversion.  I did pass the message to noc nurse Tish so she can try and also pass the message to the day shift.
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
## 2021-01-15 NOTE — Progress Notes (Signed)
Inpatient Rehab Admissions Coordinator Note:   Per therapy recommendations, pt was screened for CIR candidacy by Shann Medal, PT, DPT.  At this time we are recommending a CIR consult and I will request an order per our protocol.  Please contact me with questions.   Shann Medal, PT, DPT [phone] 01/15/21 1:38 PM
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
## 2021-01-15 NOTE — Progress Notes (Signed)
  Echocardiogram 2D Echocardiogram has been performed.  Eric Lopez 01/15/2021, 9:12 AM
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
## 2021-01-15 NOTE — Progress Notes (Signed)
Patient attempted to get out of bed multiple time, he is alert to self, place, time but unsure why he is in the hospital. He failed yale swallow eval and cont to ask for oral fluid. He pulled out his IV and attempted to remove his brace.Tylenol given for pain. MD paged for alternative safety plan.   POC:  Continue safety round (RN sitting with pt to prevent falls), Cont to reorient. Monitor VS and pain management.
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
## 2021-01-15 NOTE — Progress Notes (Signed)
Refuses to wear his SCDs.  Michela Pitcher he has "bad neuropathy" and cannot stand them on his legs.  He did try to allow them to be on for a while but said he could not stand it and removed them. He is, however, on lovenox and I did secure chat Dr Rebeca Alert to notify this.

## 2021-01-15 NOTE — Plan of Care (Signed)

## 2021-01-15 NOTE — Progress Notes (Signed)
RN continue to provide emotional support and reorienting patient. Patient noted more calm at this time. MD order [employer]. Will continue to monitor.
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
## 2021-01-16 ENCOUNTER — Inpatient Hospital Stay (HOSPITAL_COMMUNITY): Payer: Medicare HMO

## 2021-01-16 DIAGNOSIS — R410 Disorientation, unspecified: Secondary | ICD-10-CM

## 2021-01-16 DIAGNOSIS — S2243XA Multiple fractures of ribs, bilateral, initial encounter for closed fracture: Secondary | ICD-10-CM | POA: Diagnosis not present

## 2021-01-16 DIAGNOSIS — I639 Cerebral infarction, unspecified: Secondary | ICD-10-CM | POA: Diagnosis not present

## 2021-01-16 DIAGNOSIS — N179 Acute kidney failure, unspecified: Secondary | ICD-10-CM | POA: Diagnosis not present

## 2021-01-16 DIAGNOSIS — R339 Retention of urine, unspecified: Secondary | ICD-10-CM | POA: Diagnosis not present

## 2021-01-16 LAB — CBC
HCT: 29.7 % — ABNORMAL LOW (ref 39.0–52.0)
HCT: 29.4 % — ABNORMAL LOW (ref 39.0–52.0)
Hemoglobin: 9.2 g/dL — ABNORMAL LOW (ref 13.0–17.0)
Hemoglobin: 9.2 g/dL — ABNORMAL LOW (ref 13.0–17.0)
MCH: 27.2 pg (ref 26.0–34.0)
MCH: 27.1 pg (ref 26.0–34.0)
MCHC: 31 g/dL (ref 30.0–36.0)
MCHC: 31.3 g/dL (ref 30.0–36.0)
MCV: 87.9 fL (ref 80.0–100.0)
MCV: 86.7 fL (ref 80.0–100.0)
Platelets: 168 10*3/uL (ref 150–400)
Platelets: 200 10*3/uL (ref 150–400)
RBC: 3.38 MIL/uL — ABNORMAL LOW (ref 4.22–5.81)
RBC: 3.39 MIL/uL — ABNORMAL LOW (ref 4.22–5.81)
RDW: 14.6 % (ref 11.5–15.5)
RDW: 14.6 % (ref 11.5–15.5)
WBC: 16.8 10*3/uL — ABNORMAL HIGH (ref 4.0–10.5)
WBC: 18.1 10*3/uL — ABNORMAL HIGH (ref 4.0–10.5)
nRBC: 0 % (ref 0.0–0.2)
nRBC: 0 % (ref 0.0–0.2)

## 2021-01-16 LAB — GLUCOSE, CAPILLARY
Glucose-Capillary: 204 mg/dL — ABNORMAL HIGH (ref 70–99)
Glucose-Capillary: 221 mg/dL — ABNORMAL HIGH (ref 70–99)
Glucose-Capillary: 207 mg/dL — ABNORMAL HIGH (ref 70–99)

## 2021-01-16 LAB — BASIC METABOLIC PANEL
Anion gap: 11 (ref 5–15)
BUN: 33 mg/dL — ABNORMAL HIGH (ref 8–23)
CO2: 20 mmol/L — ABNORMAL LOW (ref 22–32)
Calcium: 8.3 mg/dL — ABNORMAL LOW (ref 8.9–10.3)
Chloride: 101 mmol/L (ref 98–111)
Creatinine, Ser: 1.66 mg/dL — ABNORMAL HIGH (ref 0.61–1.24)
GFR, Estimated: 41 mL/min — ABNORMAL LOW (ref >60)
Glucose, Bld: 210 mg/dL — ABNORMAL HIGH (ref 70–99)
Potassium: 4.3 mmol/L (ref 3.5–5.1)
Sodium: 132 mmol/L — ABNORMAL LOW (ref 135–145)

## 2021-01-16 LAB — D-DIMER, QUANTITATIVE: D-Dimer, Quant: 3.83 ug/mL-FEU — ABNORMAL HIGH (ref 0.00–0.50)

## 2021-01-16 MED ORDER — ACETAMINOPHEN 500 MG PO TABS
1000.0000 mg | ORAL_TABLET | Freq: Three times a day (TID) | ORAL | Status: DC
  Administered 2021-01-16 – 2021-01-21 (×16): 1000 mg via ORAL
  Filled 2021-01-16 (×17): qty 2
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
## 2021-01-16 NOTE — Progress Notes (Signed)
EEG completed, result pending.

## 2021-01-16 NOTE — Progress Notes (Signed)
Inpatient Rehab Admissions Coordinator:   Met with pt and wife at bedside to discuss recommendations of CIR. We discussed expected length of stay 2-3 weeks with goals of supervision to min assist.  Pt's wife will eventually need to return to work but can provide initial assist, and then they have a home care insurance policy she can use for more assistance.  Will open insurance for prior authorization and plan for possible admission next week pending approval and bed availability.   Shann Medal, PT, DPT Admissions Coordinator [phone] 01/16/21  4:34 PM
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
## 2021-01-16 NOTE — Progress Notes (Incomplete)
Inpatient Diabetes Program Recommendations  AACE/ADA: New Consensus Statement on Inpatient Glycemic Control (2015)  Target Ranges:  Prepandial:   less than 140 mg/dL      Peak postprandial:   less than 180 mg/dL (1-2 hours)      Critically ill patients:  140 - 180 mg/dL   Lab Results  Component Value Date   GLUCAP 245 (H) 01/15/2021   HGBA1C 8.8 (H) 01/24/2021    Review of Glycemic Control  Diabetes history: DM 2 Outpatient Diabetes medications: Amaryl 2 QAM, Metformin 1000 QD Current orders for Inpatient glycemic control:  Lantus 12 units qhs Novolog 0-15 units tid  Inpatient Diabetes Program Recommendations:    -  Increase Lantus to 16 units  Thanks,  Tama Headings RN, MSN, BC-ADM Inpatient Diabetes Coordinator Team Pager [phone] (8a-5p)

## 2021-01-17 LAB — GLUCOSE, CAPILLARY
Glucose-Capillary: 177 mg/dL — ABNORMAL HIGH (ref 70–99)
Glucose-Capillary: 141 mg/dL — ABNORMAL HIGH (ref 70–99)
Glucose-Capillary: 192 mg/dL — ABNORMAL HIGH (ref 70–99)
Glucose-Capillary: 117 mg/dL — ABNORMAL HIGH (ref 70–99)

## 2021-01-17 LAB — CBC
HCT: 28.4 % — ABNORMAL LOW (ref 39.0–52.0)
Hemoglobin: 9.5 g/dL — ABNORMAL LOW (ref 13.0–17.0)
MCH: 28.6 pg (ref 26.0–34.0)
MCHC: 33.5 g/dL (ref 30.0–36.0)
MCV: 85.5 fL (ref 80.0–100.0)
Platelets: 186 10*3/uL (ref 150–400)
RBC: 3.32 MIL/uL — ABNORMAL LOW (ref 4.22–5.81)
RDW: 14.6 % (ref 11.5–15.5)
WBC: 16 10*3/uL — ABNORMAL HIGH (ref 4.0–10.5)
nRBC: 0 % (ref 0.0–0.2)

## 2021-01-17 LAB — BASIC METABOLIC PANEL
Anion gap: 12 (ref 5–15)
BUN: 29 mg/dL — ABNORMAL HIGH (ref 8–23)
CO2: 24 mmol/L (ref 22–32)
Calcium: 8.7 mg/dL — ABNORMAL LOW (ref 8.9–10.3)
Chloride: 99 mmol/L (ref 98–111)
Creatinine, Ser: 1.39 mg/dL — ABNORMAL HIGH (ref 0.61–1.24)
GFR, Estimated: 51 mL/min — ABNORMAL LOW (ref >60)
Glucose, Bld: 205 mg/dL — ABNORMAL HIGH (ref 70–99)
Potassium: 4.2 mmol/L (ref 3.5–5.1)
Sodium: 135 mmol/L (ref 135–145)

## 2021-01-17 MED ORDER — TAMSULOSIN HCL 0.4 MG PO CAPS
0.4000 mg | ORAL_CAPSULE | Freq: Every day | ORAL | Status: DC
  Administered 2021-01-17 – 2021-01-21 (×4): 0.4 mg via ORAL
  Filled 2021-01-17 (×5): qty 1

## 2021-01-17 MED ORDER — RESOURCE THICKENUP CLEAR PO POWD
ORAL | Status: DC | PRN
  Filled 2021-01-17 (×2): qty 125

## 2021-01-17 MED ORDER — MELATONIN 3 MG PO TABS
3.0000 mg | ORAL_TABLET | Freq: Every day | ORAL | Status: DC
  Administered 2021-01-17 – 2021-01-21 (×6): 3 mg via ORAL
  Filled 2021-01-17 (×6): qty 1

## 2021-01-17 MED ORDER — INSULIN GLARGINE 100 UNIT/ML ~~LOC~~ SOLN
15.0000 [IU] | Freq: Every day | SUBCUTANEOUS | Status: DC
  Administered 2021-01-17 – 2021-01-19 (×3): 15 [IU] via SUBCUTANEOUS
  Filled 2021-01-17 (×4): qty 0.15

## 2021-01-17 MED ORDER — INSULIN ASPART 100 UNIT/ML ~~LOC~~ SOLN
3.0000 [IU] | Freq: Three times a day (TID) | SUBCUTANEOUS | Status: DC
  Administered 2021-01-17 – 2021-01-19 (×8): 3 [IU] via SUBCUTANEOUS
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
## 2021-01-18 ENCOUNTER — Inpatient Hospital Stay (HOSPITAL_COMMUNITY): Payer: Medicare HMO

## 2021-01-18 DIAGNOSIS — N179 Acute kidney failure, unspecified: Secondary | ICD-10-CM | POA: Diagnosis not present

## 2021-01-18 DIAGNOSIS — R339 Retention of urine, unspecified: Secondary | ICD-10-CM | POA: Diagnosis not present

## 2021-01-18 DIAGNOSIS — S2243XA Multiple fractures of ribs, bilateral, initial encounter for closed fracture: Secondary | ICD-10-CM | POA: Diagnosis not present

## 2021-01-18 DIAGNOSIS — R209 Unspecified disturbances of skin sensation: Secondary | ICD-10-CM

## 2021-01-18 DIAGNOSIS — I639 Cerebral infarction, unspecified: Secondary | ICD-10-CM | POA: Diagnosis not present

## 2021-01-18 LAB — GLUCOSE, CAPILLARY
Glucose-Capillary: 158 mg/dL — ABNORMAL HIGH (ref 70–99)
Glucose-Capillary: 224 mg/dL — ABNORMAL HIGH (ref 70–99)
Glucose-Capillary: 144 mg/dL — ABNORMAL HIGH (ref 70–99)

## 2021-01-18 LAB — BASIC METABOLIC PANEL
Anion gap: 11 (ref 5–15)
BUN: 26 mg/dL — ABNORMAL HIGH (ref 8–23)
CO2: 26 mmol/L (ref 22–32)
Calcium: 8.8 mg/dL — ABNORMAL LOW (ref 8.9–10.3)
Chloride: 101 mmol/L (ref 98–111)
Creatinine, Ser: 1.32 mg/dL — ABNORMAL HIGH (ref 0.61–1.24)
GFR, Estimated: 54 mL/min — ABNORMAL LOW (ref >60)
Glucose, Bld: 161 mg/dL — ABNORMAL HIGH (ref 70–99)
Potassium: 3.8 mmol/L (ref 3.5–5.1)
Sodium: 138 mmol/L (ref 135–145)

## 2021-01-18 LAB — CBC
HCT: 30.5 % — ABNORMAL LOW (ref 39.0–52.0)
Hemoglobin: 9.6 g/dL — ABNORMAL LOW (ref 13.0–17.0)
MCH: 27.5 pg (ref 26.0–34.0)
MCHC: 31.5 g/dL (ref 30.0–36.0)
MCV: 87.4 fL (ref 80.0–100.0)
Platelets: 204 10*3/uL (ref 150–400)
RBC: 3.49 MIL/uL — ABNORMAL LOW (ref 4.22–5.81)
RDW: 14.7 % (ref 11.5–15.5)
WBC: 16.4 10*3/uL — ABNORMAL HIGH (ref 4.0–10.5)
nRBC: 0.1 % (ref 0.0–0.2)

## 2021-01-18 MED ORDER — IPRATROPIUM-ALBUTEROL 0.5-2.5 (3) MG/3ML IN SOLN
3.0000 mL | Freq: Once | RESPIRATORY_TRACT | Status: DC
Start: 2021-01-18 — End: 2021-01-18

## 2021-01-18 MED ORDER — IPRATROPIUM-ALBUTEROL 0.5-2.5 (3) MG/3ML IN SOLN
3.0000 mL | Freq: Four times a day (QID) | RESPIRATORY_TRACT | Status: DC | PRN
  Administered 2021-01-18: 3 mL via RESPIRATORY_TRACT
  Filled 2021-01-18: qty 3

## 2021-01-18 MED ORDER — DM-GUAIFENESIN ER 30-600 MG PO TB12
1.0000 | ORAL_TABLET | Freq: Two times a day (BID) | ORAL | Status: DC
  Administered 2021-01-18 – 2021-01-21 (×8): 1 via ORAL
  Filled 2021-01-18 (×9): qty 1

## 2021-01-18 NOTE — Progress Notes (Signed)
ABI's have been completed. Preliminary results can be found in CV Proc through chart review.   01/18/21 12:25 PM Eric Lopez RVT
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
## 2021-01-18 NOTE — Progress Notes (Signed)
Patient orientation is waxing and wanying from 0 to 4, he continues to scoot himself to the end of the bed trying to exit, claiming he is going to get up and sleep on the couch in his house. Staff has reoriented him all shift. Patient's mood going from being kind to name calling staff. He is unsafe to mobilize with out PT, was continues to ask to get on his feet. Bathing and mouth care was provided, c/o back pain increased this shift refused back brace. Removes O2 [place], currently on @2L  saturation is 96%, breathing is even and unlabored. Nursing is sitting in room to monitor patient and ensure safety needs closely.

## 2021-01-19 ENCOUNTER — Inpatient Hospital Stay (HOSPITAL_COMMUNITY): Payer: Medicare HMO

## 2021-01-19 DIAGNOSIS — S2243XA Multiple fractures of ribs, bilateral, initial encounter for closed fracture: Secondary | ICD-10-CM | POA: Diagnosis not present

## 2021-01-19 DIAGNOSIS — R339 Retention of urine, unspecified: Secondary | ICD-10-CM | POA: Diagnosis not present

## 2021-01-19 DIAGNOSIS — I639 Cerebral infarction, unspecified: Secondary | ICD-10-CM | POA: Diagnosis not present

## 2021-01-19 DIAGNOSIS — N179 Acute kidney failure, unspecified: Secondary | ICD-10-CM | POA: Diagnosis not present

## 2021-01-19 LAB — BASIC METABOLIC PANEL
Anion gap: 10 (ref 5–15)
BUN: 30 mg/dL — ABNORMAL HIGH (ref 8–23)
CO2: 25 mmol/L (ref 22–32)
Calcium: 8.4 mg/dL — ABNORMAL LOW (ref 8.9–10.3)
Chloride: 100 mmol/L (ref 98–111)
Creatinine, Ser: 1.51 mg/dL — ABNORMAL HIGH (ref 0.61–1.24)
GFR, Estimated: 46 mL/min — ABNORMAL LOW (ref >60)
Glucose, Bld: 133 mg/dL — ABNORMAL HIGH (ref 70–99)
Potassium: 3.4 mmol/L — ABNORMAL LOW (ref 3.5–5.1)
Sodium: 135 mmol/L (ref 135–145)

## 2021-01-19 LAB — CBC
HCT: 29 % — ABNORMAL LOW (ref 39.0–52.0)
Hemoglobin: 9.4 g/dL — ABNORMAL LOW (ref 13.0–17.0)
MCH: 28.3 pg (ref 26.0–34.0)
MCHC: 32.4 g/dL (ref 30.0–36.0)
MCV: 87.3 fL (ref 80.0–100.0)
Platelets: 224 10*3/uL (ref 150–400)
RBC: 3.32 MIL/uL — ABNORMAL LOW (ref 4.22–5.81)
RDW: 15.2 % (ref 11.5–15.5)
WBC: 14.3 10*3/uL — ABNORMAL HIGH (ref 4.0–10.5)
nRBC: 0.1 % (ref 0.0–0.2)

## 2021-01-19 LAB — GLUCOSE, CAPILLARY
Glucose-Capillary: 186 mg/dL — ABNORMAL HIGH (ref 70–99)
Glucose-Capillary: 163 mg/dL — ABNORMAL HIGH (ref 70–99)
Glucose-Capillary: 128 mg/dL — ABNORMAL HIGH (ref 70–99)
Glucose-Capillary: 85 mg/dL (ref 70–99)

## 2021-01-19 MED ORDER — OLANZAPINE 10 MG IM SOLR
2.5000 mg | Freq: Once | INTRAMUSCULAR | Status: AC
  Administered 2021-01-19: 2.5 mg via INTRAMUSCULAR
  Filled 2021-01-19: qty 10

## 2021-01-19 MED ORDER — HALOPERIDOL LACTATE 5 MG/ML IJ SOLN
0.5000 mg | Freq: Once | INTRAMUSCULAR | Status: AC
  Administered 2021-01-19: 0.5 mg via INTRAMUSCULAR
  Filled 2021-01-19: qty 1

## 2021-01-19 MED ORDER — POTASSIUM CHLORIDE CRYS ER 20 MEQ PO TBCR
40.0000 meq | EXTENDED_RELEASE_TABLET | Freq: Two times a day (BID) | ORAL | Status: AC
  Administered 2021-01-19 (×2): 40 meq via ORAL
  Filled 2021-01-19 (×2): qty 2

## 2021-01-19 MED ORDER — IPRATROPIUM-ALBUTEROL 0.5-2.5 (3) MG/3ML IN SOLN
3.0000 mL | Freq: Two times a day (BID) | RESPIRATORY_TRACT | Status: DC
  Filled 2021-01-19: qty 3

## 2021-01-19 MED ORDER — OLANZAPINE 2.5 MG PO TABS
2.5000 mg | ORAL_TABLET | Freq: Every day | ORAL | Status: DC
  Administered 2021-01-19 (×2): 2.5 mg via ORAL
  Filled 2021-01-19 (×2): qty 1

## 2021-01-19 MED ORDER — IPRATROPIUM-ALBUTEROL 0.5-2.5 (3) MG/3ML IN SOLN: 3.0000 mL | Freq: Two times a day (BID) | RESPIRATORY_TRACT | Status: DC
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
## 2021-01-19 NOTE — Care Management Important Message (Signed)
Important Message  Patient Details  Name: Eric Lopez MRN: [id number] Date of Birth: 06/14/1941   Medicare Important Message Given:  Yes     Eric Lopez 01/19/2021, 4:37 PM
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
## 2021-01-19 NOTE — Progress Notes (Signed)
Inpatient Rehab Admissions Coordinator:   I have insurance authorization for inpatient rehab, but no beds available for this patient today.  Will f/u with patient tomorrow for possible admission pending bed availability.   Shann Medal, PT, DPT Admissions Coordinator [phone] 01/19/21  3:05 PM

## 2021-01-19 NOTE — Progress Notes (Signed)
OT Cancellation Note  Patient Details Name: COSTAS SENA MRN: [id number] DOB: [birth year]   Cancelled Treatment:    Reason Eval/Treat Not Completed: Other (comment) MD at bedside at this time. OT to check back as time allows.   Gloris [place] OTR/L Supplemental OT, Department of rehab services [phone]  Yobana Culliton R H. 01/19/2021, 11:18 AM

## 2021-01-19 NOTE — Progress Notes (Signed)
Modified Barium Swallow Progress Note  Patient Details  Name: Eric Lopez MRN: [id number] Date of Birth: 04/18/1941  Today's Date: 01/19/2021  Modified Barium Swallow completed.  Full report located under Chart Review in the Imaging Section.  Brief recommendations include the following:  Clinical Impression  Pt was seen for a modified barium swallow study and presents with moderate oropharyngeal dysphagia c/b laryngeal penetration of thin liquid, puree, nectar-thick liquid, honey-thick liquid, and puree, and aspiration of thin liquid via cup sip.   Laryngeal penetration and aspiration mostly occurred before/during the swallow secondary to reduced laryngeal closure.  Oral phase was remarkable for reduced lingual strength and control resulting in min-mild oral residue and premature spillage to the valleculae or pyriform sinuses.  Pharyngeal phase was remarkable for reduced BOT retraction, reduced hyolaryngeal elevation/excursion, and reduced pharyngeal constriction, resulting in vallecular, pyriform, and posterior pharyngeal wall residue.  Pt was observed to have moderate-severe vallecular residue with all PO trials, and he benefited from additional effortful swallows to help clear the bolus.  Vallecular residue was the most profound with puree trials.  Pt benefited from a nectar-thick liquid wash, which helped to clear the bolus without laryngeal penetration or aspiration noted (laryngeal penetration was observed with NTL trials by themselves).  Laryngeal penetration of nectar-thick liquid and honey-thick liquid remained above the vocal cords and was cleared independently (PAS 2).  Unable to try postural changes on this study secondary to cervical collar.  Pharyngoesophageal phase was unremarkable.   Recommend Dysphagia 1 (puree) solids and nectar-thick liquid with medications administered crushed in puree.  Pt will benefit from alternating bites and sips, clearing his through intermittently during  PO intake, and from additional dry swallows after each bite/sip.  Pt may have tsp of water (preferably via spoon) between meals (at least 30 minutes after a meal), following thorough oral care and full supervision per the [employer] Protocol.   Swallow Evaluation Recommendations       SLP Diet Recommendations: Dysphagia 1 (Puree) solids;Nectar thick liquid;Free water protocol after oral care   Liquid Administration via: Cup;Straw   Medication Administration: Crushed with puree   Supervision: Staff to assist with self feeding;Full supervision/cueing for compensatory strategies   Compensations: Minimize environmental distractions;Slow rate;Small sips/bites;Follow solids with liquid;Multiple dry swallows after each bite/sip;Clear throat intermittently       Oral Care Recommendations: Oral care BID;Oral care before and after PO       Colin Mulders M.S., [place] Acute Rehabilitation Services Office: [phone]  [place] 01/19/2021,10:35 AM
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
## 2021-01-19 NOTE — Progress Notes (Signed)
Inpatient Rehab Admissions Coordinator:   Received request for updated clinicals from [facility] and I have sent them.  Will likely hear back from them today regarding CIR determination.   Shann Medal, PT, DPT Admissions Coordinator [phone] 01/19/21  12:32 PM
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
## 2021-01-20 ENCOUNTER — Inpatient Hospital Stay (HOSPITAL_COMMUNITY)
Admission: RE | Admit: 2021-01-20 | Payer: Medicare HMO | Source: Intra-hospital | Admitting: Physical Medicine & Rehabilitation

## 2021-01-20 ENCOUNTER — Inpatient Hospital Stay (HOSPITAL_COMMUNITY): Payer: Medicare HMO

## 2021-01-20 DIAGNOSIS — N179 Acute kidney failure, unspecified: Secondary | ICD-10-CM | POA: Diagnosis not present

## 2021-01-20 DIAGNOSIS — I639 Cerebral infarction, unspecified: Secondary | ICD-10-CM | POA: Diagnosis not present

## 2021-01-20 DIAGNOSIS — R339 Retention of urine, unspecified: Secondary | ICD-10-CM | POA: Diagnosis not present

## 2021-01-20 DIAGNOSIS — S2243XA Multiple fractures of ribs, bilateral, initial encounter for closed fracture: Secondary | ICD-10-CM | POA: Diagnosis not present

## 2021-01-20 LAB — BASIC METABOLIC PANEL
Anion gap: 15 (ref 5–15)
BUN: 39 mg/dL — ABNORMAL HIGH (ref 8–23)
CO2: 21 mmol/L — ABNORMAL LOW (ref 22–32)
Calcium: 8.7 mg/dL — ABNORMAL LOW (ref 8.9–10.3)
Chloride: 103 mmol/L (ref 98–111)
Creatinine, Ser: 1.94 mg/dL — ABNORMAL HIGH (ref 0.61–1.24)
GFR, Estimated: 34 mL/min — ABNORMAL LOW (ref >60)
Glucose, Bld: 78 mg/dL (ref 70–99)
Potassium: 3.8 mmol/L (ref 3.5–5.1)
Sodium: 139 mmol/L (ref 135–145)

## 2021-01-20 LAB — CBC
HCT: 31 % — ABNORMAL LOW (ref 39.0–52.0)
Hemoglobin: 9.5 g/dL — ABNORMAL LOW (ref 13.0–17.0)
MCH: 27.3 pg (ref 26.0–34.0)
MCHC: 30.6 g/dL (ref 30.0–36.0)
MCV: 89.1 fL (ref 80.0–100.0)
Platelets: 283 10*3/uL (ref 150–400)
RBC: 3.48 MIL/uL — ABNORMAL LOW (ref 4.22–5.81)
RDW: 15.8 % — ABNORMAL HIGH (ref 11.5–15.5)
WBC: 15.6 10*3/uL — ABNORMAL HIGH (ref 4.0–10.5)
nRBC: 0 % (ref 0.0–0.2)

## 2021-01-20 LAB — GLUCOSE, CAPILLARY
Glucose-Capillary: 65 mg/dL — ABNORMAL LOW (ref 70–99)
Glucose-Capillary: 83 mg/dL (ref 70–99)
Glucose-Capillary: 160 mg/dL — ABNORMAL HIGH (ref 70–99)
Glucose-Capillary: 227 mg/dL — ABNORMAL HIGH (ref 70–99)
Glucose-Capillary: 133 mg/dL — ABNORMAL HIGH (ref 70–99)
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
## 2021-01-20 MED ORDER — INSULIN GLARGINE 100 UNIT/ML ~~LOC~~ SOLN
10.0000 [IU] | Freq: Every day | SUBCUTANEOUS | Status: DC
  Filled 2021-01-20: qty 0.1

## 2021-01-20 MED ORDER — IPRATROPIUM-ALBUTEROL 0.5-2.5 (3) MG/3ML IN SOLN
RESPIRATORY_TRACT | Status: AC
  Filled 2021-01-20: qty 3

## 2021-01-20 MED ORDER — INSULIN GLARGINE 100 UNIT/ML ~~LOC~~ SOLN
5.0000 [IU] | Freq: Every day | SUBCUTANEOUS | Status: DC
  Administered 2021-01-20 – 2021-01-21 (×2): 5 [IU] via SUBCUTANEOUS
  Filled 2021-01-20 (×3): qty 0.05

## 2021-01-20 MED ORDER — HALOPERIDOL LACTATE 5 MG/ML IJ SOLN: 0.5000 mg | INTRAMUSCULAR | Status: DC | PRN

## 2021-01-20 MED ORDER — LACTATED RINGERS IV BOLUS
500.0000 mL | Freq: Once | INTRAVENOUS | Status: AC
  Administered 2021-01-20: 500 mL via INTRAVENOUS

## 2021-01-20 MED ORDER — HALOPERIDOL LACTATE 5 MG/ML IJ SOLN: 0.5000 mg | Freq: Once | INTRAMUSCULAR | Status: DC

## 2021-01-20 MED ORDER — HYDROMORPHONE HCL 1 MG/ML IJ SOLN
0.5000 mg | INTRAMUSCULAR | Status: DC | PRN
  Administered 2021-01-20 – 2021-01-22 (×6): 0.5 mg via INTRAVENOUS
  Filled 2021-01-20 (×6): qty 0.5

## 2021-01-20 MED ORDER — OLANZAPINE 2.5 MG PO TABS
5.0000 mg | ORAL_TABLET | Freq: Every day | ORAL | Status: DC
  Administered 2021-01-20 – 2021-01-21 (×2): 5 mg via ORAL
  Filled 2021-01-20 (×2): qty 2

## 2021-01-20 MED ORDER — IPRATROPIUM-ALBUTEROL 0.5-2.5 (3) MG/3ML IN SOLN
3.0000 mL | Freq: Four times a day (QID) | RESPIRATORY_TRACT | Status: DC | PRN
  Administered 2021-01-20: 3 mL via RESPIRATORY_TRACT
  Filled 2021-01-20: qty 3

## 2021-01-20 NOTE — Progress Notes (Signed)
Inpatient Rehab Admissions Coordinator:   Per Dr. Naaman Plummer, pt not ready to admit to CIR today due to lethargy and respiratory status.  Will hold admit today and f/u tomorrow.   Shann Medal, PT, DPT Admissions Coordinator [phone] 01/20/21  12:49 PM
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
## 2021-01-20 NOTE — TOC Transition Note (Addendum)
Transition of Care [facility]) - CM/SW Discharge Note   Patient Details  Name: Eric Lopez MRN: [id number] Date of Birth: 1941-08-04  Transition of Care [facility] CM/SW Contact:  Pollie Friar, RN Phone Number: 01/20/2021, 10:58 AM   Clinical Narrative:    Patient is discharging to CIR today. CM signing off.  1246: CIR is holding on admission today. CM following.  Final next level of care: IP Rehab Facility Barriers to Discharge: No Barriers Identified   Patient Goals and CMS Choice        Discharge Placement                       Discharge Plan and Services                                     Social Determinants of Health (SDOH) Interventions     Readmission Risk Interventions No flowsheet data found.

## 2021-01-20 NOTE — Progress Notes (Signed)
Pt is in need of suctioning due to weak cough. At this time pt has refused suctioning. RN is aware of pt's refusal.
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
## 2021-01-20 NOTE — Progress Notes (Signed)
Inpatient Rehab Admissions Coordinator:    I have insurance approval and a bed available for pt to admit to CIR today. Dr. Alfonse Spruce in agreement.  Will let pt/family and TOC team know.   Shann Medal, PT, DPT Admissions Coordinator [phone] 01/20/21  9:52 AM
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
## 2021-01-20 NOTE — Progress Notes (Signed)
Upon assessment nurse noticed patient was having increased work of breathing, worse than it had been and O2 sat was 90% on 4L [place]. Called respiratory and they recommended to consult rapid response. Rapid and MD called, now at bedside. MD consulting with family about plan of care.

## 2021-01-21 DIAGNOSIS — I639 Cerebral infarction, unspecified: Secondary | ICD-10-CM | POA: Diagnosis not present

## 2021-01-21 DIAGNOSIS — S2243XA Multiple fractures of ribs, bilateral, initial encounter for closed fracture: Secondary | ICD-10-CM | POA: Diagnosis not present

## 2021-01-21 DIAGNOSIS — R339 Retention of urine, unspecified: Secondary | ICD-10-CM | POA: Diagnosis not present

## 2021-01-21 DIAGNOSIS — N179 Acute kidney failure, unspecified: Secondary | ICD-10-CM | POA: Diagnosis not present

## 2021-01-21 LAB — CBC
HCT: 32.3 % — ABNORMAL LOW (ref 39.0–52.0)
Hemoglobin: 9.9 g/dL — ABNORMAL LOW (ref 13.0–17.0)
MCH: 27.5 pg (ref 26.0–34.0)
MCHC: 30.7 g/dL (ref 30.0–36.0)
MCV: 89.7 fL (ref 80.0–100.0)
Platelets: 283 10*3/uL (ref 150–400)
RBC: 3.6 MIL/uL — ABNORMAL LOW (ref 4.22–5.81)
RDW: 16.5 % — ABNORMAL HIGH (ref 11.5–15.5)
WBC: 17.9 10*3/uL — ABNORMAL HIGH (ref 4.0–10.5)
nRBC: 0.2 % (ref 0.0–0.2)

## 2021-01-21 LAB — BASIC METABOLIC PANEL
Anion gap: 14 (ref 5–15)
BUN: 56 mg/dL — ABNORMAL HIGH (ref 8–23)
CO2: 21 mmol/L — ABNORMAL LOW (ref 22–32)
Calcium: 8.6 mg/dL — ABNORMAL LOW (ref 8.9–10.3)
Chloride: 104 mmol/L (ref 98–111)
Creatinine, Ser: 2.34 mg/dL — ABNORMAL HIGH (ref 0.61–1.24)
GFR, Estimated: 27 mL/min — ABNORMAL LOW (ref >60)
Glucose, Bld: 194 mg/dL — ABNORMAL HIGH (ref 70–99)
Potassium: 5.3 mmol/L — ABNORMAL HIGH (ref 3.5–5.1)
Sodium: 139 mmol/L (ref 135–145)

## 2021-01-21 LAB — GLUCOSE, CAPILLARY
Glucose-Capillary: 174 mg/dL — ABNORMAL HIGH (ref 70–99)
Glucose-Capillary: 172 mg/dL — ABNORMAL HIGH (ref 70–99)
Glucose-Capillary: 157 mg/dL — ABNORMAL HIGH (ref 70–99)
Glucose-Capillary: 119 mg/dL — ABNORMAL HIGH (ref 70–99)
Glucose-Capillary: 175 mg/dL — ABNORMAL HIGH (ref 70–99)

## 2021-01-21 LAB — CREATININE, URINE, RANDOM: Creatinine, Urine: 170.12 mg/dL

## 2021-01-21 LAB — SODIUM, URINE, RANDOM: Sodium, Ur: 30 mmol/L

## 2021-01-21 LAB — POTASSIUM: Potassium: 5 mmol/L (ref 3.5–5.1)

## 2021-01-21 MED ORDER — LACTATED RINGERS IV BOLUS: 500.0000 mL | Freq: Once | INTRAVENOUS | Status: DC

## 2021-01-21 MED ORDER — SALINE SPRAY 0.65 % NA SOLN
1.0000 | NASAL | Status: DC | PRN
  Administered 2021-01-22: 1 via NASAL
  Filled 2021-01-21: qty 44

## 2021-01-21 MED ORDER — LACTATED RINGERS IV BOLUS
1000.0000 mL | Freq: Once | INTRAVENOUS | Status: AC
  Administered 2021-01-21: 1000 mL via INTRAVENOUS
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
## 2021-01-21 NOTE — Progress Notes (Signed)
Inpatient Rehab Admissions Coordinator:   Met with pt and daughter at bedside.  Pt keeps eyes closed but does answer questions when prompted.  Will follow for now, and Butch Penny in agreement to see how pt does over the next few days.   Shann Medal, PT, DPT Admissions Coordinator [phone] 01/21/21  12:25 PM

## 2021-01-21 NOTE — Plan of Care (Signed)
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
## 2021-01-21 NOTE — Progress Notes (Signed)
Pt. noted to have not voided in the last 4 hours. Pt. was bladder scanned with results of 738 mL. MD notified and received new order for I&O cath x1 and bladder scan q shift. I&O cath performed per sterile technique with output of 1,100 mL of urine. Pt. tolerated well. Urine collected for sodium and creatinine and sent to lab.

## 2021-01-22 ENCOUNTER — Inpatient Hospital Stay (HOSPITAL_COMMUNITY): Payer: Medicare HMO

## 2021-01-22 DIAGNOSIS — S2243XA Multiple fractures of ribs, bilateral, initial encounter for closed fracture: Secondary | ICD-10-CM | POA: Diagnosis not present

## 2021-01-22 DIAGNOSIS — R339 Retention of urine, unspecified: Secondary | ICD-10-CM | POA: Diagnosis not present

## 2021-01-22 DIAGNOSIS — N179 Acute kidney failure, unspecified: Secondary | ICD-10-CM | POA: Diagnosis not present

## 2021-01-22 DIAGNOSIS — I639 Cerebral infarction, unspecified: Secondary | ICD-10-CM | POA: Diagnosis not present

## 2021-01-22 LAB — BASIC METABOLIC PANEL
Anion gap: 14 (ref 5–15)
BUN: 67 mg/dL — ABNORMAL HIGH (ref 8–23)
CO2: 18 mmol/L — ABNORMAL LOW (ref 22–32)
Calcium: 8.5 mg/dL — ABNORMAL LOW (ref 8.9–10.3)
Chloride: 102 mmol/L (ref 98–111)
Creatinine, Ser: 1.96 mg/dL — ABNORMAL HIGH (ref 0.61–1.24)
GFR, Estimated: 34 mL/min — ABNORMAL LOW (ref >60)
Glucose, Bld: 199 mg/dL — ABNORMAL HIGH (ref 70–99)
Potassium: 4.5 mmol/L (ref 3.5–5.1)
Sodium: 134 mmol/L — ABNORMAL LOW (ref 135–145)

## 2021-01-22 LAB — CBC
HCT: 29.5 % — ABNORMAL LOW (ref 39.0–52.0)
Hemoglobin: 9.4 g/dL — ABNORMAL LOW (ref 13.0–17.0)
MCH: 28.6 pg (ref 26.0–34.0)
MCHC: 31.9 g/dL (ref 30.0–36.0)
MCV: 89.7 fL (ref 80.0–100.0)
Platelets: 286 10*3/uL (ref 150–400)
RBC: 3.29 MIL/uL — ABNORMAL LOW (ref 4.22–5.81)
RDW: 16.9 % — ABNORMAL HIGH (ref 11.5–15.5)
WBC: 16.1 10*3/uL — ABNORMAL HIGH (ref 4.0–10.5)
nRBC: 0.3 % — ABNORMAL HIGH (ref 0.0–0.2)

## 2021-01-22 LAB — URINALYSIS, ROUTINE W REFLEX MICROSCOPIC
Bilirubin Urine: NEGATIVE
Glucose, UA: NEGATIVE mg/dL
Ketones, ur: NEGATIVE mg/dL
Nitrite: NEGATIVE
Protein, ur: 30 mg/dL — AB
RBC / HPF: >50 RBC/hpf — ABNORMAL HIGH (ref 0–5)
Specific Gravity, Urine: 1.017 (ref 1.005–1.030)
WBC, UA: >50 WBC/hpf — ABNORMAL HIGH (ref 0–5)
pH: 5 (ref 5.0–8.0)

## 2021-01-22 LAB — GLUCOSE, CAPILLARY
Glucose-Capillary: 183 mg/dL — ABNORMAL HIGH (ref 70–99)
Glucose-Capillary: 182 mg/dL — ABNORMAL HIGH (ref 70–99)
Glucose-Capillary: 130 mg/dL — ABNORMAL HIGH (ref 70–99)
Glucose-Capillary: 148 mg/dL — ABNORMAL HIGH (ref 70–99)

## 2021-01-22 IMAGING — US US RENAL
1 series · 14 of 25 positions shown · non-contrast
Comparison: CT [DATE]

CLINICAL DATA: UTI

EXAM:
RENAL / URINARY TRACT ULTRASOUND COMPLETE

[Series 1: us renal · 14 of 35 slices shown]
[im 1/35]
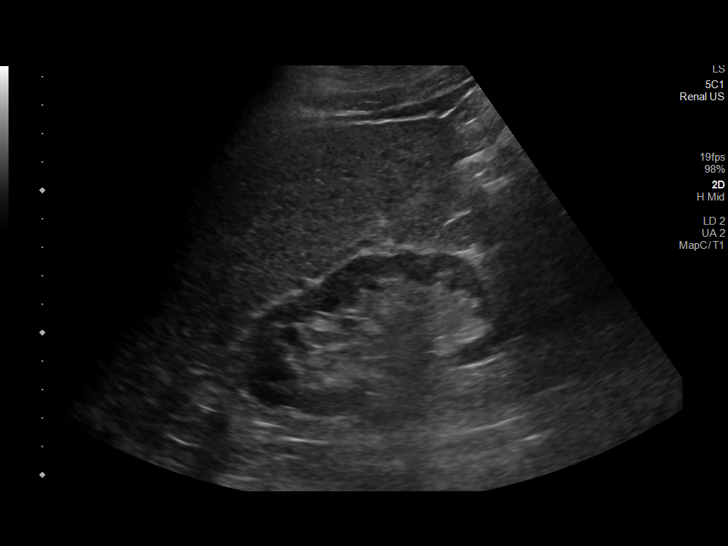
[im 3/35]
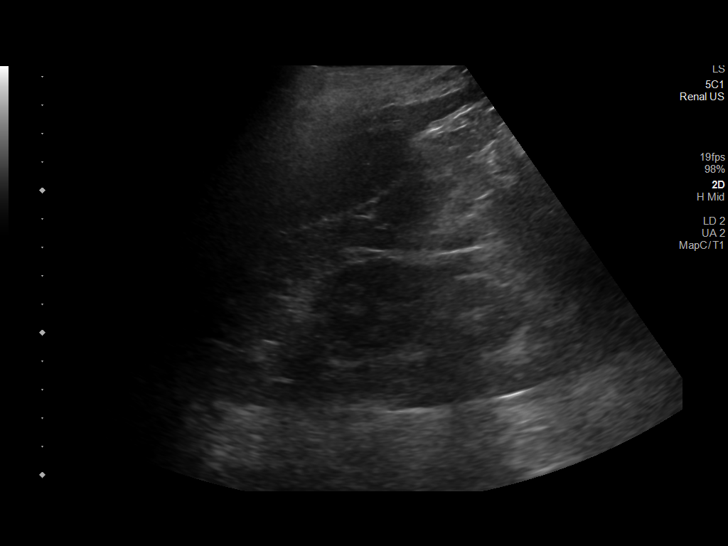
[im 6/35]
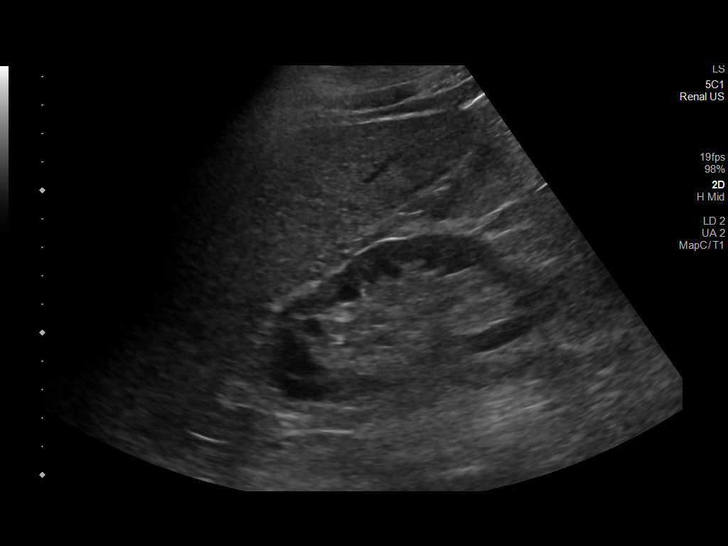
[im 9/35]
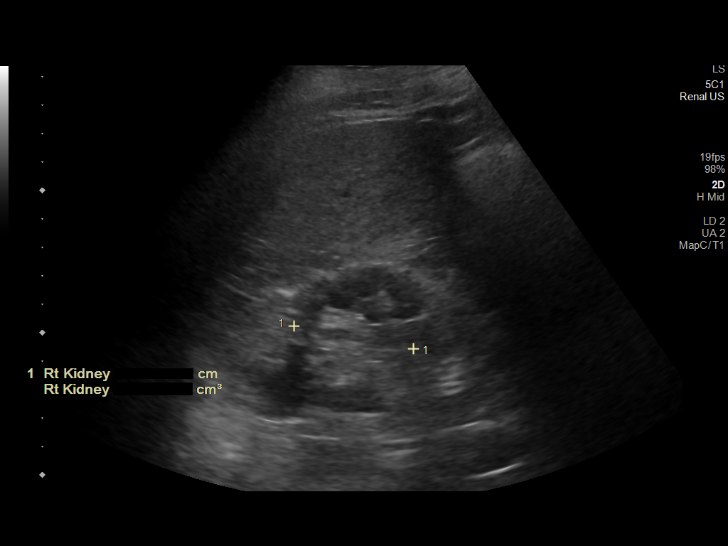
[im 12/35]
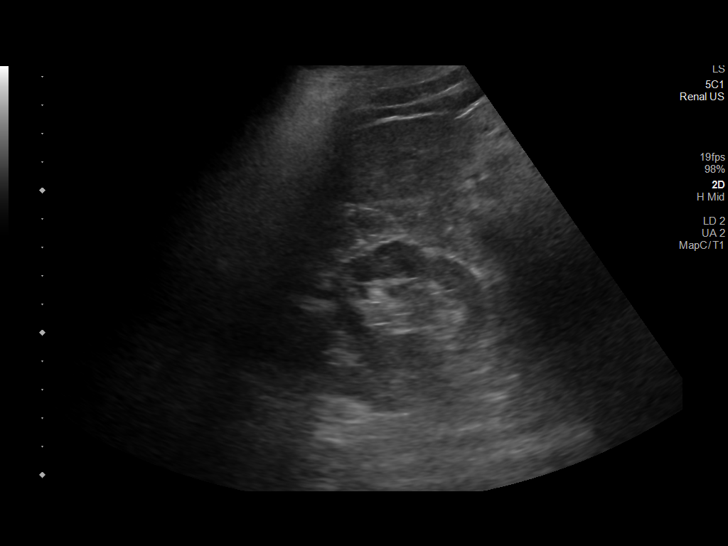
[im 13/35]
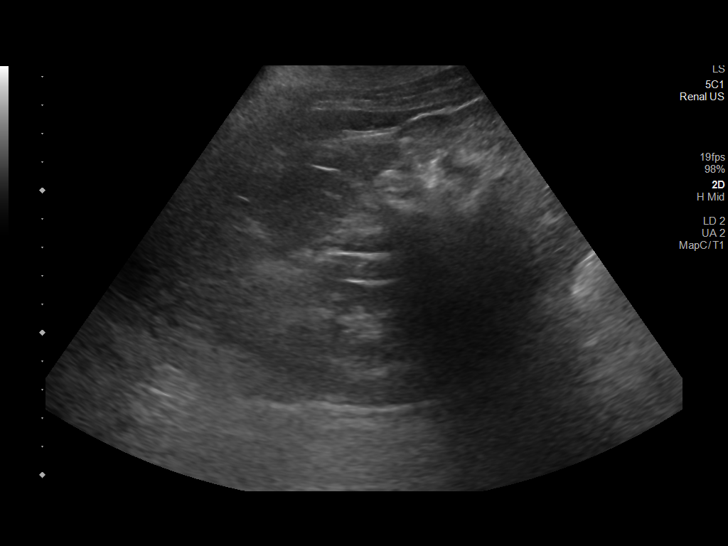
[im 16/35]
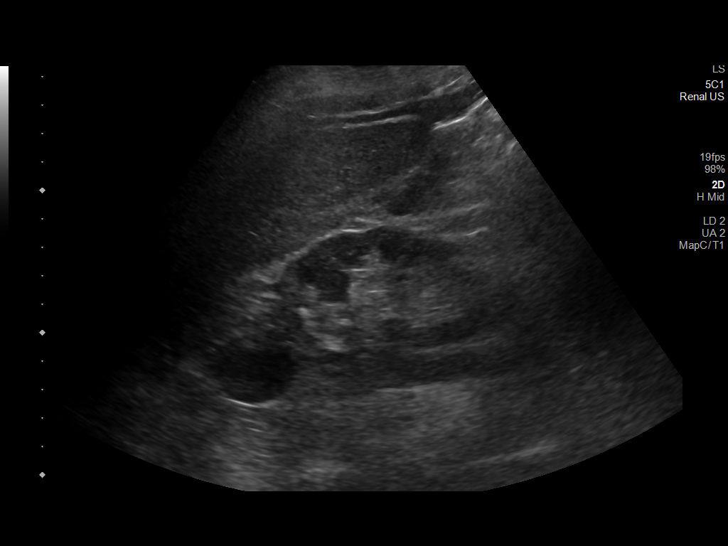
[im 19/35]
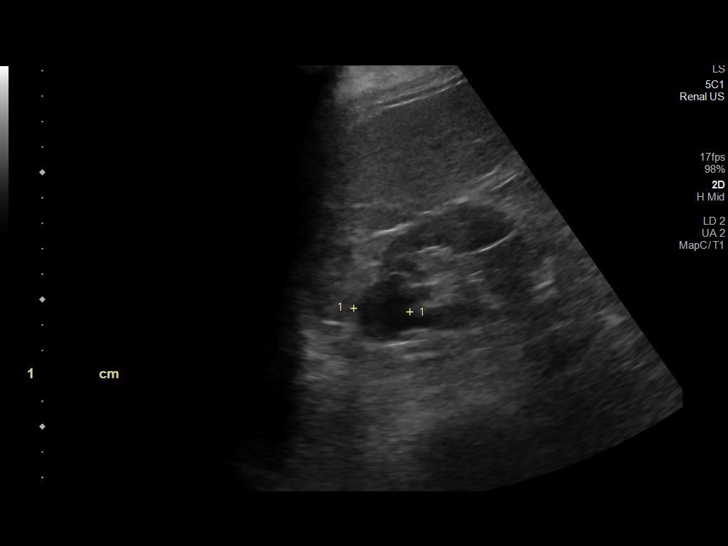
[im 22/35]
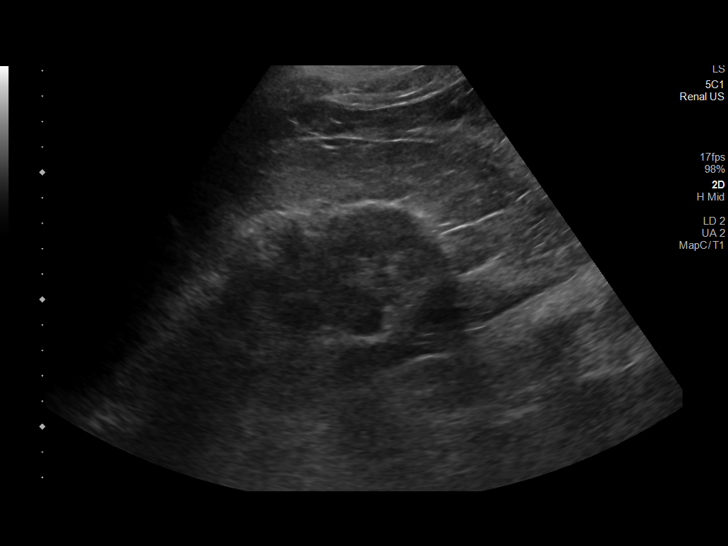
[im 23/35]
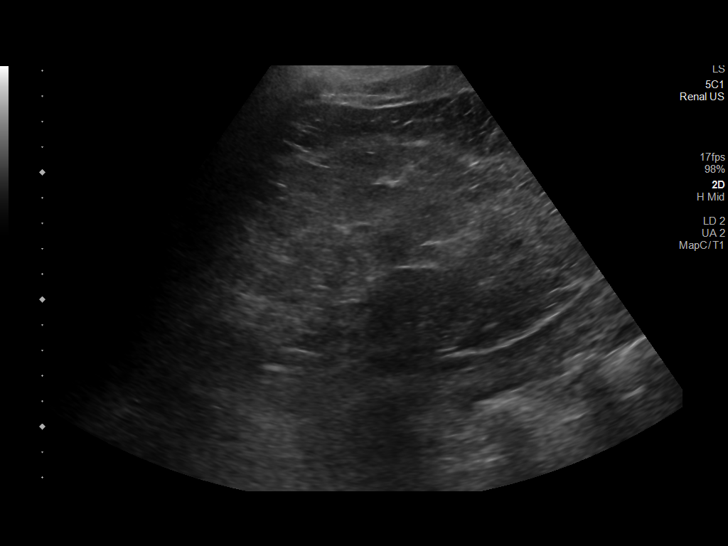
[im 26/35]
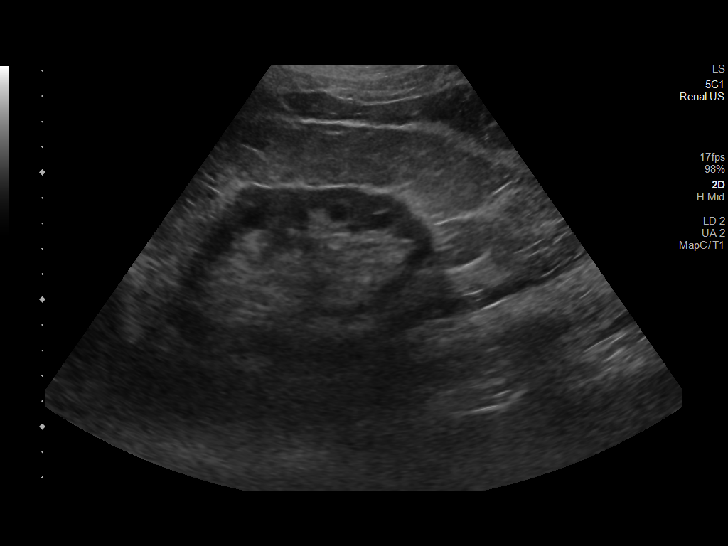
[im 29/35]
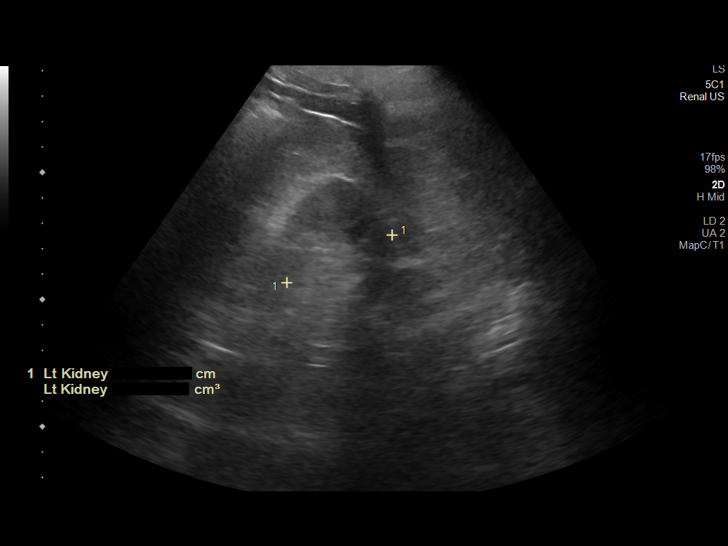
[im 32/35]
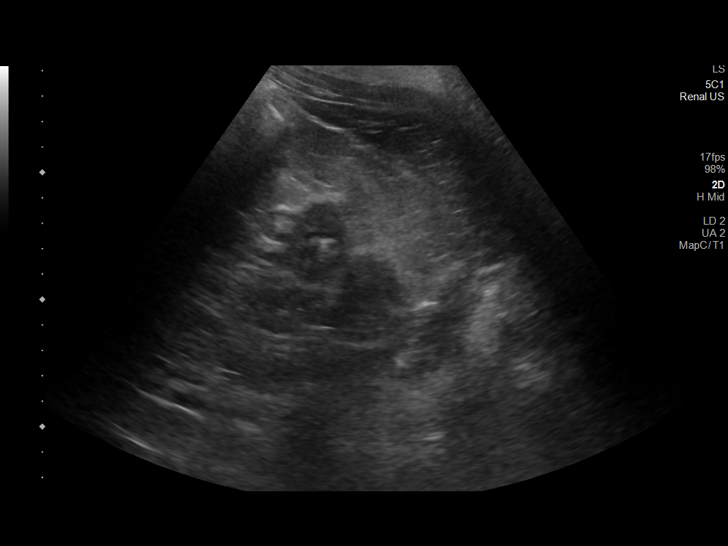
[im 35/35]
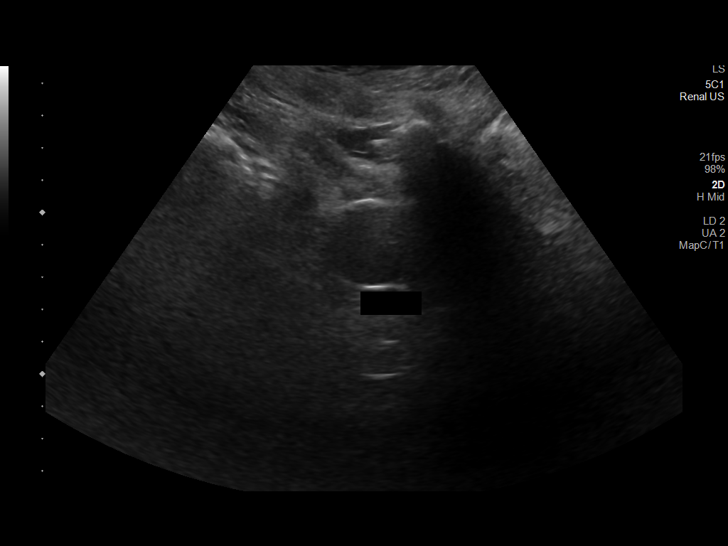

[14 of 25 positions shown; findings below may reference images not displayed]

FINDINGS: Right Kidney:

Renal measurements: 3 x 2.2 x 2.2 cm = volume: 111.2 mL. Mild
diffuse cortical thinning. No hydronephrosis. Simple appearing cyst
upper pole right kidney measuring 3 cm.

Left Kidney:

Renal measurements: 10.8 x 5.3 x 4.5 cm = volume: 135.3 mL. Mild
diffuse cortical thinning. No hydronephrosis or mass.

Bladder:

Decompressed by Foley catheter

Other:

None.
IMPRESSION: 1. Mild diffuse cortical thinning consistent with atrophy. No
hydronephrosis
2. Urinary bladder decompressed by Foley catheter
3. Benign-appearing cyst in the right kidney

## 2021-01-22 MED ORDER — CHLORHEXIDINE GLUCONATE CLOTH 2 % EX PADS
6.0000 | MEDICATED_PAD | Freq: Every day | CUTANEOUS | Status: DC
  Administered 2021-01-22: 6 via TOPICAL

## 2021-01-22 MED ORDER — INSULIN GLARGINE 100 UNIT/ML ~~LOC~~ SOLN
10.0000 [IU] | Freq: Every day | SUBCUTANEOUS | Status: DC
  Filled 2021-01-22: qty 0.1

## 2021-01-22 MED ORDER — ENOXAPARIN SODIUM 40 MG/0.4ML ~~LOC~~ SOLN
40.0000 mg | Freq: Every day | SUBCUTANEOUS | Status: DC
  Administered 2021-01-22: 40 mg via SUBCUTANEOUS
  Filled 2021-01-22: qty 0.4

## 2021-01-26 LAB — GLUCOSE, CAPILLARY: Glucose-Capillary: 35 mg/dL — CL (ref 70–99)
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
## 2021-01-27 NOTE — Progress Notes (Signed)
STATUS UPDATE  Paged to bedside by RN at 2210. At bedside, RN reported about 30 minutes prior the patient was tachycardic with P in the 140s. During her evaluation, patient with labored breathing and rhonchorous lung sounds. RT called to bedside and attempted to suction patient without success. Patient's breathing became more labored and respirations became agonal. He was pulseless and apneic on their exam.  Death exam performed by myself and Dr. Gilford Rile. General: patient in bed, still Cardiovascular: no pulse Pulmonary/Chest: apneic for one minute with no pulse Neurological: no response to verbal or painful stimuli; pupils fixed and dilated bilaterally without response to light; corneal reflex absent; negative doll's eyes  - Death confirmed at 22:10. - Patient's daughter, Verdis Frederickson, notified by telephone. She is en route to be with her mother (patient's wife) for emotional support. Will notify patient's wife once Verdis Frederickson is with her.  [place]andria Lodge, MD PGY-1 Internal Medicine Teaching Service Pager: [phone] 2021/02/11
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
## 2021-01-27 NOTE — Progress Notes (Signed)
MEWS is driven by HR, Pt goes in and out of sinus tach
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
## 2021-01-27 NOTE — Progress Notes (Signed)
Inpatient Rehab Admissions Coordinator:   Met with patient and his daughter at bedside this AM and again this PM.  Pt sleeping throughout both of my visits, though does wake to my voice and briefly tells me he feels okay today on my second visit.  Discussed progress towards rehab and pt appears to be slightly improved over Tuesday's presentation.  We discussed possibly re-evaluating whether pt may be ready for CIR tomorrow.  However, appears that after my second visit, documentation in IMTS note reflects possible palliative consult 2/2 suspected aspiration event as the cause of respiratory decline and pt now NPO.  Consult for palliative not ordered at this time.  But would need a clearer picture of GOC, and source of nutrition, prior to pursuing such an intensive rehab program.   Shann Medal, PT, DPT Admissions Coordinator [phone] 2021-02-04  4:05 PM

## 2021-01-27 DEATH — deceased
